# Patient Record
Sex: Female | Born: 1970
Health system: Southern US, Community
[De-identification: ages and names within clinical notes are randomized; demographics above are authoritative.]

## PROBLEM LIST (undated history)

## (undated) DIAGNOSIS — R112 Nausea with vomiting, unspecified: Secondary | ICD-10-CM

## (undated) DIAGNOSIS — F419 Anxiety disorder, unspecified: Secondary | ICD-10-CM

## (undated) DIAGNOSIS — J45909 Unspecified asthma, uncomplicated: Secondary | ICD-10-CM

## (undated) DIAGNOSIS — Z9889 Other specified postprocedural states: Secondary | ICD-10-CM

## (undated) DIAGNOSIS — R609 Edema, unspecified: Secondary | ICD-10-CM

## (undated) DIAGNOSIS — M79A29 Nontraumatic compartment syndrome of unspecified lower extremity: Secondary | ICD-10-CM

## (undated) DIAGNOSIS — T79A21A Traumatic compartment syndrome of right lower extremity, initial encounter: Secondary | ICD-10-CM

## (undated) DIAGNOSIS — K219 Gastro-esophageal reflux disease without esophagitis: Secondary | ICD-10-CM

## (undated) DIAGNOSIS — G4733 Obstructive sleep apnea (adult) (pediatric): Secondary | ICD-10-CM

## (undated) DIAGNOSIS — E039 Hypothyroidism, unspecified: Secondary | ICD-10-CM

## (undated) HISTORY — PX: ENDOMETRIAL ABLATION: SHX621

## (undated) HISTORY — PX: TUBAL LIGATION: SHX77

## (undated) HISTORY — DX: Obstructive sleep apnea (adult) (pediatric): G47.33

## (undated) HISTORY — PX: ROTATOR CUFF REPAIR: SHX139

## (undated) HISTORY — PX: ABDOMINAL HYSTERECTOMY: SHX81

## (undated) HISTORY — PX: DILATION AND CURETTAGE OF UTERUS: SHX78

---

## 1998-09-24 ENCOUNTER — Encounter: Admission: RE | Admit: 1998-09-24 | Discharge: 1998-12-23 | Payer: Self-pay | Admitting: Gynecology

## 1998-11-07 ENCOUNTER — Inpatient Hospital Stay (HOSPITAL_COMMUNITY): Admission: AD | Admit: 1998-11-07 | Discharge: 1998-11-10 | Payer: Self-pay | Admitting: Gynecology

## 1998-12-25 ENCOUNTER — Other Ambulatory Visit: Admission: RE | Admit: 1998-12-25 | Discharge: 1998-12-25 | Payer: Self-pay | Admitting: Gynecology

## 2000-01-15 ENCOUNTER — Other Ambulatory Visit: Admission: RE | Admit: 2000-01-15 | Discharge: 2000-01-15 | Payer: Self-pay | Admitting: Gynecology

## 2000-09-26 ENCOUNTER — Inpatient Hospital Stay (HOSPITAL_COMMUNITY): Admission: AD | Admit: 2000-09-26 | Discharge: 2000-09-28 | Payer: Self-pay | Admitting: Gynecology

## 2000-09-30 ENCOUNTER — Encounter: Admission: RE | Admit: 2000-09-30 | Discharge: 2000-11-01 | Payer: Self-pay | Admitting: Gynecology

## 2000-11-15 ENCOUNTER — Other Ambulatory Visit: Admission: RE | Admit: 2000-11-15 | Discharge: 2000-11-15 | Payer: Self-pay | Admitting: Gynecology

## 2002-01-19 ENCOUNTER — Other Ambulatory Visit: Admission: RE | Admit: 2002-01-19 | Discharge: 2002-01-19 | Payer: Self-pay | Admitting: Gynecology

## 2002-08-06 ENCOUNTER — Encounter: Admission: RE | Admit: 2002-08-06 | Discharge: 2002-08-06 | Payer: Self-pay | Admitting: Family Medicine

## 2002-09-13 ENCOUNTER — Encounter: Admission: RE | Admit: 2002-09-13 | Discharge: 2002-09-13 | Payer: Self-pay | Admitting: Family Medicine

## 2002-10-01 ENCOUNTER — Encounter: Admission: RE | Admit: 2002-10-01 | Discharge: 2002-10-01 | Payer: Self-pay | Admitting: Family Medicine

## 2002-11-02 ENCOUNTER — Encounter: Admission: RE | Admit: 2002-11-02 | Discharge: 2002-11-02 | Payer: Self-pay | Admitting: Family Medicine

## 2002-11-23 ENCOUNTER — Encounter: Admission: RE | Admit: 2002-11-23 | Discharge: 2002-11-23 | Payer: Self-pay | Admitting: Family Medicine

## 2003-10-04 ENCOUNTER — Other Ambulatory Visit: Admission: RE | Admit: 2003-10-04 | Discharge: 2003-10-04 | Payer: Self-pay | Admitting: Gynecology

## 2005-02-24 ENCOUNTER — Other Ambulatory Visit: Admission: RE | Admit: 2005-02-24 | Discharge: 2005-02-24 | Payer: Self-pay | Admitting: Gynecology

## 2005-11-30 ENCOUNTER — Ambulatory Visit: Payer: Self-pay | Admitting: Internal Medicine

## 2006-01-06 ENCOUNTER — Ambulatory Visit: Payer: Self-pay | Admitting: Internal Medicine

## 2006-12-15 ENCOUNTER — Ambulatory Visit (HOSPITAL_COMMUNITY): Admission: RE | Admit: 2006-12-15 | Discharge: 2006-12-15 | Payer: Self-pay | Admitting: Obstetrics and Gynecology

## 2006-12-15 ENCOUNTER — Encounter (INDEPENDENT_AMBULATORY_CARE_PROVIDER_SITE_OTHER): Payer: Self-pay | Admitting: Specialist

## 2006-12-19 ENCOUNTER — Inpatient Hospital Stay (HOSPITAL_COMMUNITY): Admission: AD | Admit: 2006-12-19 | Discharge: 2006-12-19 | Payer: Self-pay | Admitting: Obstetrics & Gynecology

## 2008-01-05 ENCOUNTER — Encounter: Admission: RE | Admit: 2008-01-05 | Discharge: 2008-01-05 | Payer: Self-pay | Admitting: Gastroenterology

## 2008-02-14 ENCOUNTER — Ambulatory Visit: Payer: Self-pay | Admitting: Vascular Surgery

## 2011-05-04 NOTE — Procedures (Signed)
UPPER EXTREMITY ARTERIAL EVALUATION-SEGMENTALS   INDICATION:  Both hands feel cold.   HISTORY:  Diabetes:  No.  Cardiac:  No.  Hypertension:  No.  Smoking:  No.  Previous Surgery:  No.   RESTING SYSTOLIC PRESSURES                                   RIGHT                LEFT  Brachial:                        110                  110  Radial:                          110                  110  Ulnar:                           110                  106  DOPPLER WAVEFORM ANALYSIS:  Brachial:                        Triphasic            Triphasic  Radial:                          Triphasic            Triphasic  Ulnar:                           Triphasic            Triphasic  Superficial palmar arch:   IMPRESSION:  1. No evidence of upper extremity arterial occlusive disease      bilaterally.  2. PPG wave forms of both fourth fingers return to normal within 10      minutes post cold challenge.   ___________________________________________  Janetta Hora. Darrick Penna, MD   DP/MEDQ  D:  02/15/2008  T:  02/16/2008  Job:  [id number]

## 2011-05-04 NOTE — Assessment & Plan Note (Signed)
OFFICE VISIT   PA, TENNANT  DOB:  January 21, 1971                                       02/14/2008  ZOXWR#:[id number]   Patient is a 40 year old female who is very physically active and  recently noticed that her left second toe had become purple and painful  during a recent cold spell.  This has been present for a few weeks now.  She denies any prior similar episodes.  She states that always her hands  and feet have become cold easily.  She has never had any ulcerations or  nonhealing wounds on the feet.  She denies rest pain or claudication  symptoms.  She denies any history of diabetes, hypertension, elevated  cholesterol, coronary artery disease, and has no significant family  history of vascular disease.  She is a nonsmoker.   PAST SURGICAL HISTORY:  Unremarkable.   PAST MEDICAL HISTORY:  Remarkable for chronic migraines.   MEDICATIONS:  1. Prozac 20 mg once daily.  2. Topamax 150 mg once daily.  3. Hyoscyamine 0.375 mg once daily.  4. Triamterene/hydrochlorothiazide 75/50 once daily.  This is given      for swelling.  5. Phentermine 37.5 mg once daily.   She has no known drug allergies.   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  She has two children.  She is a nondrinker of alcohol.  She  does not smoke cigarettes.   REVIEW OF SYSTEMS:  She has had some varying of her weight up and down.  She is 5 feet 8, 162 pounds.  She denies a history of chest pain,  cardiac arrhythmia, asthma, or wheezing.  She does have chronic  abdominal pain with diarrhea and constipation.  She denies a history of  renal dysfunction, TIA, or stroke.  She has no history of DVT or  superficial thrombophlebitis.  She does have a history of chronic  migraines.  She also has a history of some mild depression and anxiety.  She denies a history of a hypercoagulable state or any recent changes in  her vision or hearing.   PHYSICAL EXAMINATION:  Blood pressure is 130/81 in the left arm.  Heart  rate is 100 and regular.  HEENT:  Unremarkable.  She has 2+ carotid  pulses without bruits.  Chest:  Clear to auscultation.  Cardiac:  Regular rate and rhythm without murmur.  Abdomen is soft, nontender and  nondistended.  No masses.  She has 2+ femoral and 2+ dorsalis pedis  pulses bilaterally.  She has 2+ brachial and radial pulses bilaterally.  The left second toe is slightly darker, compared to the other toes of  the left and right foot.  Otherwise, there are no ulcerations on the  feet, and the feet are warm and adequately perfused.   She underwent a cold challenge noninvasive vascular exam.  Her hands and  feet were within normal limits with cold challenge.  The right second  toe was also within normal limits to cold challenge.  This returned to  normal perfusion after five minutes of immersion in cold water.  The  left second toe returned to normal after 10 minutes, which was mildly  abnormal.  However, overall, her noninvasive vascular exam was not  completely consistent with Raynaud's or vasospastic-type disorders.  In summary, patient has had some type of ischemic insult to her left  second toe.  She does not display any considerable risk factors for  hypercoagulable state and essentially has an overall negative exam for  vasospastic disease.  This could possibly represent some type of embolic  event.  I believe the best management for now is close observation.  We  will see her back in one month's time or sooner if she has further  deterioration of her symptoms.  At that time, we will consider whether  or not to pursue further evaluation of her toe lesion.   Janetta Hora. Fields, MD  Electronically Signed   CEF/MEDQ  D:  02/22/2008  T:  02/22/2008  Job:  [id number]   cc:   Tammy R. Collins [place], M.D.

## 2011-05-04 NOTE — Procedures (Signed)
LOWER EXTREMITY ARTERIAL EVALUATION-SINGLE LEVEL   INDICATION:  Left second toe, fingers feel cold.   HISTORY:  Diabetes:  No.  Cardiac:  No.  Hypertension:  No.  Smoking:  No.  Previous Surgery:  No.   RESTING SYSTOLIC PRESSURES: (ABI)                          RIGHT                LEFT  Brachial:               110                  110  Anterior tibial:        110                  110  Posterior tibial:       122 (greater than 1.0)                    124  (greater than 1.0)  Peroneal:  DOPPLER WAVEFORM ANALYSIS:  Anterior tibial:        Triphasic            Triphasic  Posterior tibial:       Triphasic            Triphasic  Peroneal:   PREVIOUS ABI'S:  Date:  RIGHT:  LEFT:   IMPRESSION:  1. No evidence of lower extremity arterial occlusive disease      bilaterally.  2. PPG waveforms of right and left second toes return to normal within      10 minutes post cold challenge.   ___________________________________________  Janetta Hora. Darrick Penna, MD   DP/MEDQ  D:  02/15/2008  T:  02/16/2008  Job:  [id number]

## 2011-05-07 NOTE — Discharge Summary (Signed)
[facility]  Patient:    Theresa Cuevas, Theresa Cuevas              MRN: [id number] Adm. Date:  [id number] Disc. Date: [id number] Attending:  Douglass Rivers Dictator:   Antony Contras, [facility]                           Discharge Summary  DISCHARGE DIAGNOSES:          Intrauterine pregnancy at term, history of depression, RhoGAM candidate.  PROCEDURE:                    Normal spontaneous vaginal delivery over intact perineum, reduction of nuchal cord.  HISTORY OF PRESENT ILLNESS:   Patient is a 40 year old gravida 3, para 1-0-1-1 with LMP December 24, 1999, [facility] September 29, 2000.  Prenatal risk factors include a history of depression.  Patient was on Prozac during the pregnancy.  She is also Rh negative.  PRENATAL LABORATORIES:        Blood type Rh A-.  Antibody screen negative. RPR, HBsAg, HIV nonreactive.  Rubella immune.  MSAFP normal.  GBS negative.  HOSPITAL COURSE:              Patient was admitted on September 26, 2000 with uterine contractions.  Cervix on admission was 5 cm, 70%, -2.  Patient progressed very quickly to complete dilatation and was delivered of an Apgar 9/9 female infant over an intact perineum.  Birth weight 9 pounds 2 ounces. Nuchal cord reduced.  Postpartum course:  Patient remained afebrile.  Had no difficulty voiding.  Was able to be discharged on her second postpartum day. CBC:  Hematocrit 30.3, hemoglobin 10.9, platelets ______, WBC 13.1.  Baby was also Rh negative so she was not a RhoGAM candidate.  ______.  DISPOSITION:                  Follow up in six weeks.  Continue prenatal vitamins and iron.  Motrin, Tylox for pain. DD:  10/19/00 TD:  10/19/00 Job: [id number] XL/KG401

## 2011-05-07 NOTE — Op Note (Signed)
Theresa Cuevas, Theresa Cuevas          ACCOUNT NO.:  [id number]   MEDICAL RECORD NO.:  [id number]          PATIENT TYPE:  AMB   LOCATION:  SDC                           FACILITY:  [facility]   PHYSICIAN:  Michelle L. Grewal, M.D.DATE OF BIRTH:  1971/01/11   DATE OF PROCEDURE:  12/15/2006  DATE OF DISCHARGE:                               OPERATIVE REPORT   PREOPERATIVE DIAGNOSES:  1. Pelvic pain.  2. Desires permanent sterilization.  3. Menorrhagia.   POSTOPERATIVE DIAGNOSES:  1. Pelvic pain.  2. Desires permanent sterilization.  3. Menorrhagia.  4. Left ovarian cyst.   PROCEDURES:  1. Diagnostic laparoscopy.  2. Bilateral tubal ligation.  3. Drainage of left ovarian cyst.  4. Dilatation and curettage.  5. Diagnostic hysteroscopy.  6. ThermaChoice endometrial ablation.   SURGEON:  Michelle L. Vincente Poli, M.D.   ANESTHESIA:  General.   SPECIMENS:  Uterine curettings.   ESTIMATED BLOOD LOSS:  Minimal.   COMPLICATIONS:  None.   PROCEDURE:  The patient was taken to the operating room.  She was  intubated.  She was prepped and draped.  An in-and-out catheter was used  to empty the bladder.  A uterine manipulator was inserted.  Attention  was turned to the abdomen.  Local was infiltrated at the umbilicus and  the suprapubic region.  A small incision was made in the umbilicus and a  Veress needle was inserted one time and pneumoperitoneum was achieved  without difficulty.  The Veress needle was removed, the trocar was  inserted.  The patient was gently placed in Trendelenburg position and  the laparoscope was introduced through the trocar sheath.  A 5-mm trocar  was placed suprapubically in the midline under direct visualization.  We  then inspected the upper abdomen, which was normal.  Liver and  gallbladder were normal.  No adhesions were noted, and the bowel  surfaces appeared normal.  The pelvis appeared normal as well.  The  uterus was normal, was mobile, midpositional.  The  cul-de-sac was clean.  There was no evidence of endometriosis anywhere in the pelvis.  The  tubes and ovaries were unremarkable except for a small simple cyst on  the left ovary, which I then drained using Kleppingers.  There was no  evidence of endometriosis.  I saw nothing that might explain this  patient's left-sided pelvic pain.  I did go ahead and grasp the  midportion of each tube and did a tubal ligation bilaterally using the  triple burn cautery technique with Kleppingers.  The pneumoperitoneum  was released, the trocars were removed, and the incisions were closed  with Dermabond skin adhesive with excellent approximation.  All sponge,  lap and instrument counts were correct.  At this point we then went  vaginally, where I placed a speculum in the vagina, removed the uterine  manipulator and then dilated the cervical internal os.  The diagnostic  hysteroscope was inserted.  The uterus cavity appeared normal.  There  was some menstrual blood noted and endometrium but nothing that looked  like a fibroid or polyp.  The hysteroscope was removed and a thorough  uterine curettage was  performed.  All tissue was removed and sent to  pathology.  At this point, the ThermaChoice III balloon was inserted and  ThermaChoice endometrial ablation was performed in the standard fashion  for 8 minutes.  At the end of the procedure the intact balloon was  removed.  There was no bleeding noted.  All instruments were removed  from the vagina.  All sponge, lap and instrument counts were correct x2.  The patient went to the recovery room in stable condition.      Michelle L. Vincente Poli, M.D.  Electronically Signed     MLG/MEDQ  D:  12/15/2006  T:  12/15/2006  Job:  [id number]

## 2011-11-10 ENCOUNTER — Encounter (HOSPITAL_COMMUNITY): Payer: Self-pay | Admitting: Pharmacist

## 2011-11-12 NOTE — Patient Instructions (Addendum)
   Your procedure is scheduled ON:GEXBMWU December 4th  Enter through the Main Entrance of [facility] at:6am Pick up the phone at the desk and dial [phone] and inform us of your arrival.  Please call this number if you have any problems the morning of surgery: [phone]  Remember: Do not eat food after midnight:Monday Do not drink clear liquids after:midnight Monday Take these medicines the morning of surgery with a SIP OF WATER:Prozac, tagamet,  Do not wear jewelry, make-up, or FINGER nail polish Do not wear lotions, powders, or perfumes.  You may not  wear deodorant. Do not shave 48 hours prior to surgery. Do not bring valuables to the hospital.  Leave suitcase in the car. After Surgery it may be brought to your room. For patients being admitted to the hospital, checkout time is 11:00am the day of discharge.     Remember to use your hibiclens as instructed.Please shower with 1/2 bottle the evening before your surgery and the other 1/2 bottle the morning of surgery.

## 2011-11-16 ENCOUNTER — Encounter (HOSPITAL_COMMUNITY)
Admission: RE | Admit: 2011-11-16 | Discharge: 2011-11-16 | Disposition: A | Discharge status: Home / Self Care | Payer: BC Managed Care – PPO | Source: Ambulatory Visit | Attending: Obstetrics and Gynecology | Admitting: Obstetrics and Gynecology

## 2011-11-16 ENCOUNTER — Encounter (HOSPITAL_COMMUNITY): Payer: Self-pay

## 2011-11-16 HISTORY — DX: Anxiety disorder, unspecified: F41.9

## 2011-11-16 HISTORY — DX: Other specified postprocedural states: Z98.890

## 2011-11-16 HISTORY — DX: Nausea with vomiting, unspecified: R11.2

## 2011-11-16 HISTORY — DX: Edema, unspecified: R60.9

## 2011-11-16 HISTORY — DX: Gastro-esophageal reflux disease without esophagitis: K21.9

## 2011-11-16 LAB — CBC
HCT: 42 % (ref 36.0–46.0)
Hemoglobin: 14.7 g/dL (ref 12.0–15.0)
MCV: 89.4 fL (ref 78.0–100.0)
Platelets: 284 10*3/uL (ref 150–400)
RBC: 4.7 MIL/uL (ref 3.87–5.11)
WBC: 7.3 10*3/uL (ref 4.0–10.5)

## 2011-11-16 LAB — BASIC METABOLIC PANEL
CO2: 27 mEq/L (ref 19–32)
Chloride: 99 mEq/L (ref 96–112)
GFR calc non Af Amer: 78 mL/min — ABNORMAL LOW (ref >90)
Glucose, Bld: 101 mg/dL — ABNORMAL HIGH (ref 70–99)
Potassium: 3.9 mEq/L (ref 3.5–5.1)
Sodium: 136 mEq/L (ref 135–145)

## 2011-11-22 MED ORDER — CLINDAMYCIN PHOSPHATE 900 MG/50ML IV SOLN
900.0000 mg | INTRAVENOUS | Status: DC
  Filled 2011-11-22: qty 50

## 2011-11-22 MED ORDER — CIPROFLOXACIN IN D5W 400 MG/200ML IV SOLN
400.0000 mg | INTRAVENOUS | Status: DC
  Filled 2011-11-22: qty 200

## 2011-11-22 NOTE — H&P (Signed)
40 year old Gravida 3 Para 2 presents for LAVH and possible perineoplasty.  She complains of terrible dysmenorrhea.  She tried Generesse but the symptoms did not improve.  Her period is very heavy.  She has history of endometrial ablation and bilateral tubal ligation.  She also feels like her tampon does not stay in rit. Medical history Headaches  Surgical history btl Endometrial ablation  Meds prozac  Allergies Amoxicillin and Progesterone  Ros  Unremarkable  Social history No alcohol or tobacco  Family history Positive for pancreatic and lung cancer  Afebrile Vital signs stable General alert and oriented Lung CTAB Car RRR Abdomen is soft and non tender Pelvic  uterus is normal size, retroverted tender adnexae not enlarged  Impression Dysmenorrhea Menorrhagia  Plan LAVH Possible perineoplasty Risks are reviewed with patient Outlined in office chart

## 2011-11-23 ENCOUNTER — Encounter (HOSPITAL_COMMUNITY)
Admission: RE | Disposition: A | Discharge status: Home / Self Care | Payer: Self-pay | Source: Ambulatory Visit | Attending: Obstetrics and Gynecology

## 2011-11-23 ENCOUNTER — Encounter (HOSPITAL_COMMUNITY): Payer: Self-pay | Admitting: Anesthesiology

## 2011-11-23 ENCOUNTER — Ambulatory Visit (HOSPITAL_COMMUNITY)
Admission: RE | Admit: 2011-11-23 | Discharge: 2011-11-24 | Disposition: A | Discharge status: Home / Self Care | Payer: BC Managed Care – PPO | Source: Ambulatory Visit | Attending: Obstetrics and Gynecology | Admitting: Obstetrics and Gynecology

## 2011-11-23 ENCOUNTER — Ambulatory Visit (HOSPITAL_COMMUNITY): Payer: BC Managed Care – PPO | Admitting: Anesthesiology

## 2011-11-23 ENCOUNTER — Other Ambulatory Visit: Payer: Self-pay | Admitting: Obstetrics and Gynecology

## 2011-11-23 DIAGNOSIS — N946 Dysmenorrhea, unspecified: Secondary | ICD-10-CM | POA: Insufficient documentation

## 2011-11-23 DIAGNOSIS — Z90710 Acquired absence of both cervix and uterus: Secondary | ICD-10-CM

## 2011-11-23 DIAGNOSIS — N8189 Other female genital prolapse: Secondary | ICD-10-CM | POA: Insufficient documentation

## 2011-11-23 DIAGNOSIS — N949 Unspecified condition associated with female genital organs and menstrual cycle: Secondary | ICD-10-CM | POA: Insufficient documentation

## 2011-11-23 DIAGNOSIS — N8000 Endometriosis of the uterus, unspecified: Secondary | ICD-10-CM | POA: Insufficient documentation

## 2011-11-23 HISTORY — PX: PERINEOPLASTY: SHX2218

## 2011-11-23 HISTORY — PX: LAPAROSCOPIC ASSISTED VAGINAL HYSTERECTOMY: SHX5398

## 2011-11-23 SURGERY — HYSTERECTOMY, VAGINAL, LAPAROSCOPY-ASSISTED
Anesthesia: General | Site: Abdomen | Wound class: Clean Contaminated

## 2011-11-23 MED ORDER — TEMAZEPAM 15 MG PO CAPS: 15.0000 mg | ORAL_CAPSULE | Freq: Every evening | ORAL | Status: DC | PRN

## 2011-11-23 MED ORDER — LIDOCAINE HCL (CARDIAC) 20 MG/ML IV SOLN
INTRAVENOUS | Status: DC | PRN
  Administered 2011-11-23: 60 mg via INTRAVENOUS

## 2011-11-23 MED ORDER — PROPOFOL 10 MG/ML IV EMUL
INTRAVENOUS | Status: AC
  Filled 2011-11-23: qty 20

## 2011-11-23 MED ORDER — NEOSTIGMINE METHYLSULFATE 1 MG/ML IJ SOLN
INTRAMUSCULAR | Status: DC | PRN
  Administered 2011-11-23: 1 mg via INTRAVENOUS

## 2011-11-23 MED ORDER — MIDAZOLAM HCL 2 MG/2ML IJ SOLN
INTRAMUSCULAR | Status: AC
  Filled 2011-11-23: qty 2

## 2011-11-23 MED ORDER — GLYCOPYRROLATE 0.2 MG/ML IJ SOLN
INTRAMUSCULAR | Status: DC | PRN
  Administered 2011-11-23: 0.2 mg via INTRAVENOUS
  Administered 2011-11-23: 0.1 mg via INTRAVENOUS

## 2011-11-23 MED ORDER — PROMETHAZINE HCL 25 MG/ML IJ SOLN: 12.5000 mg | INTRAMUSCULAR | Status: DC | PRN

## 2011-11-23 MED ORDER — KETOROLAC TROMETHAMINE 30 MG/ML IJ SOLN
30.0000 mg | Freq: Once | INTRAMUSCULAR | Status: AC
  Administered 2011-11-23: 30 mg via INTRAVENOUS
  Filled 2011-11-23: qty 1

## 2011-11-23 MED ORDER — HYDROMORPHONE HCL PF 1 MG/ML IJ SOLN
INTRAMUSCULAR | Status: AC
  Administered 2011-11-23: 0.5 mg via INTRAVENOUS
  Filled 2011-11-23: qty 1

## 2011-11-23 MED ORDER — SODIUM CHLORIDE 0.9 % IJ SOLN: 9.0000 mL | INTRAMUSCULAR | Status: DC | PRN

## 2011-11-23 MED ORDER — TRAMADOL HCL 50 MG PO TABS: 50.0000 mg | ORAL_TABLET | Freq: Four times a day (QID) | ORAL | Status: DC | PRN

## 2011-11-23 MED ORDER — SCOPOLAMINE 1 MG/3DAYS TD PT72
1.0000 | MEDICATED_PATCH | TRANSDERMAL | Status: DC
  Administered 2011-11-23: 1.5 mg via TRANSDERMAL

## 2011-11-23 MED ORDER — LIDOCAINE HCL (CARDIAC) 20 MG/ML IV SOLN
INTRAVENOUS | Status: AC
  Filled 2011-11-23: qty 5

## 2011-11-23 MED ORDER — CIPROFLOXACIN IN D5W 400 MG/200ML IV SOLN
INTRAVENOUS | Status: DC | PRN
  Administered 2011-11-23: 400 mg via INTRAVENOUS

## 2011-11-23 MED ORDER — HYDROMORPHONE HCL PF 1 MG/ML IJ SOLN
INTRAMUSCULAR | Status: AC
  Filled 2011-11-23: qty 1

## 2011-11-23 MED ORDER — DIPHENHYDRAMINE HCL 50 MG/ML IJ SOLN
INTRAMUSCULAR | Status: AC
  Administered 2011-11-23: 12.5 mg via INTRAVENOUS
  Filled 2011-11-23: qty 1

## 2011-11-23 MED ORDER — LACTATED RINGERS IV SOLN
INTRAVENOUS | Status: DC
  Administered 2011-11-23: 1000 mL via INTRAVENOUS
  Administered 2011-11-23: 08:00:00 via INTRAVENOUS

## 2011-11-23 MED ORDER — DEXAMETHASONE SODIUM PHOSPHATE 10 MG/ML IJ SOLN
INTRAMUSCULAR | Status: AC
  Filled 2011-11-23: qty 1

## 2011-11-23 MED ORDER — NALOXONE HCL 0.4 MG/ML IJ SOLN: 0.4000 mg | INTRAMUSCULAR | Status: DC | PRN

## 2011-11-23 MED ORDER — DEXTROSE IN LACTATED RINGERS 5 % IV SOLN
INTRAVENOUS | Status: DC
  Administered 2011-11-23 – 2011-11-24 (×3): via INTRAVENOUS

## 2011-11-23 MED ORDER — ONDANSETRON HCL 4 MG/2ML IJ SOLN: 4.0000 mg | Freq: Four times a day (QID) | INTRAMUSCULAR | Status: DC | PRN

## 2011-11-23 MED ORDER — ONDANSETRON HCL 4 MG/2ML IJ SOLN
INTRAMUSCULAR | Status: AC
  Filled 2011-11-23: qty 2

## 2011-11-23 MED ORDER — SODIUM CHLORIDE 0.9 % IJ SOLN
INTRAMUSCULAR | Status: DC | PRN
  Administered 2011-11-23: 10 mL via INTRAVENOUS

## 2011-11-23 MED ORDER — KETOROLAC TROMETHAMINE 30 MG/ML IJ SOLN
INTRAMUSCULAR | Status: DC | PRN
  Administered 2011-11-23: 30 mg via INTRAVENOUS

## 2011-11-23 MED ORDER — MENTHOL 3 MG MT LOZG: 1.0000 | LOZENGE | OROMUCOSAL | Status: DC | PRN

## 2011-11-23 MED ORDER — ROCURONIUM BROMIDE 50 MG/5ML IV SOLN
INTRAVENOUS | Status: AC
  Filled 2011-11-23: qty 1

## 2011-11-23 MED ORDER — IBUPROFEN 600 MG PO TABS
600.0000 mg | ORAL_TABLET | Freq: Four times a day (QID) | ORAL | Status: DC | PRN
  Administered 2011-11-24: 600 mg via ORAL
  Filled 2011-11-23: qty 1

## 2011-11-23 MED ORDER — KETOROLAC TROMETHAMINE 30 MG/ML IJ SOLN
INTRAMUSCULAR | Status: AC
  Filled 2011-11-23: qty 1

## 2011-11-23 MED ORDER — DIPHENHYDRAMINE HCL 12.5 MG/5ML PO ELIX
12.5000 mg | ORAL_SOLUTION | Freq: Four times a day (QID) | ORAL | Status: DC | PRN
  Administered 2011-11-23 – 2011-11-24 (×3): 12.5 mg via ORAL
  Filled 2011-11-23 (×3): qty 5

## 2011-11-23 MED ORDER — MIDAZOLAM HCL 5 MG/5ML IJ SOLN
INTRAMUSCULAR | Status: DC | PRN
  Administered 2011-11-23: 2 mg via INTRAVENOUS

## 2011-11-23 MED ORDER — DIPHENHYDRAMINE HCL 50 MG/ML IJ SOLN
12.5000 mg | Freq: Once | INTRAMUSCULAR | Status: AC
  Administered 2011-11-23: 12.5 mg via INTRAVENOUS

## 2011-11-23 MED ORDER — HYDROMORPHONE HCL PF 1 MG/ML IJ SOLN
0.2500 mg | INTRAMUSCULAR | Status: DC | PRN
  Administered 2011-11-23 (×4): 0.5 mg via INTRAVENOUS

## 2011-11-23 MED ORDER — BUPIVACAINE HCL (PF) 0.25 % IJ SOLN
INTRAMUSCULAR | Status: DC | PRN
  Administered 2011-11-23: 10 mL

## 2011-11-23 MED ORDER — CLINDAMYCIN PHOSPHATE 600 MG/50ML IV SOLN
INTRAVENOUS | Status: DC | PRN
  Administered 2011-11-23: 900 mg via INTRAVENOUS

## 2011-11-23 MED ORDER — HYDROMORPHONE HCL PF 1 MG/ML IJ SOLN
INTRAMUSCULAR | Status: DC | PRN
  Administered 2011-11-23 (×2): 1 mg via INTRAVENOUS

## 2011-11-23 MED ORDER — SCOPOLAMINE 1 MG/3DAYS TD PT72
MEDICATED_PATCH | TRANSDERMAL | Status: AC
  Filled 2011-11-23: qty 1

## 2011-11-23 MED ORDER — NEOSTIGMINE METHYLSULFATE 1 MG/ML IJ SOLN
INTRAMUSCULAR | Status: AC
  Filled 2011-11-23: qty 10

## 2011-11-23 MED ORDER — ROCURONIUM BROMIDE 100 MG/10ML IV SOLN
INTRAVENOUS | Status: DC | PRN
  Administered 2011-11-23: 30 mg via INTRAVENOUS

## 2011-11-23 MED ORDER — ONDANSETRON HCL 4 MG/2ML IJ SOLN
INTRAMUSCULAR | Status: DC | PRN
  Administered 2011-11-23: 4 mg via INTRAVENOUS

## 2011-11-23 MED ORDER — DIPHENHYDRAMINE HCL 50 MG/ML IJ SOLN
12.5000 mg | Freq: Four times a day (QID) | INTRAMUSCULAR | Status: DC | PRN
  Administered 2011-11-23: 12.5 mg via INTRAVENOUS
  Filled 2011-11-23: qty 1

## 2011-11-23 MED ORDER — GLYCOPYRROLATE 0.2 MG/ML IJ SOLN
INTRAMUSCULAR | Status: AC
  Filled 2011-11-23: qty 1

## 2011-11-23 MED ORDER — LACTATED RINGERS IR SOLN
Status: DC | PRN
  Administered 2011-11-23: 3000 mL

## 2011-11-23 MED ORDER — PROPOFOL 10 MG/ML IV EMUL
INTRAVENOUS | Status: DC | PRN
  Administered 2011-11-23: 150 mg via INTRAVENOUS

## 2011-11-23 MED ORDER — FENTANYL CITRATE 0.05 MG/ML IJ SOLN
INTRAMUSCULAR | Status: DC | PRN
  Administered 2011-11-23: 100 ug via INTRAVENOUS
  Administered 2011-11-23: 50 ug via INTRAVENOUS
  Administered 2011-11-23: 100 ug via INTRAVENOUS

## 2011-11-23 MED ORDER — FENTANYL CITRATE 0.05 MG/ML IJ SOLN
INTRAMUSCULAR | Status: AC
  Filled 2011-11-23: qty 5

## 2011-11-23 MED ORDER — HYDROMORPHONE 0.3 MG/ML IV SOLN
INTRAVENOUS | Status: DC
  Administered 2011-11-23: 7.5 mg via INTRAVENOUS
  Administered 2011-11-23: 3.59 mg via INTRAVENOUS
  Administered 2011-11-23: 8 mg via INTRAVENOUS
  Administered 2011-11-23: 6 mg via INTRAVENOUS
  Administered 2011-11-24: 7.5 mg via INTRAVENOUS
  Administered 2011-11-24: 1.59 mg via INTRAVENOUS
  Administered 2011-11-24: 3.15 mg via INTRAVENOUS
  Filled 2011-11-23: qty 25

## 2011-11-23 SURGICAL SUPPLY — 46 items
BARRIER ADHS 3X4 INTERCEED (GAUZE/BANDAGES/DRESSINGS) IMPLANT
BLADE SURG 15 STRL LF C SS BP (BLADE) ×2 IMPLANT
BLADE SURG 15 STRL SS (BLADE) ×1
CABLE HIGH FREQUENCY MONO STRZ (ELECTRODE) IMPLANT
CATH ROBINSON RED A/P 16FR (CATHETERS) IMPLANT
CHLORAPREP W/TINT 26ML (MISCELLANEOUS) ×3 IMPLANT
CLOTH BEACON ORANGE TIMEOUT ST (SAFETY) ×3 IMPLANT
CONT PATH 16OZ SNAP LID 3702 (MISCELLANEOUS) ×3 IMPLANT
COUNTER NEEDLE 1200 MAGNETIC (NEEDLE) IMPLANT
COVER TABLE BACK 60X90 (DRAPES) ×3 IMPLANT
DECANTER SPIKE VIAL GLASS SM (MISCELLANEOUS) IMPLANT
DERMABOND ADVANCED (GAUZE/BANDAGES/DRESSINGS) ×1
DERMABOND ADVANCED .7 DNX12 (GAUZE/BANDAGES/DRESSINGS) ×2 IMPLANT
ELECT REM PT RETURN 9FT ADLT (ELECTROSURGICAL) ×3
ELECTRODE REM PT RTRN 9FT ADLT (ELECTROSURGICAL) ×2 IMPLANT
EVACUATOR SMOKE 8.L (FILTER) IMPLANT
GLOVE BIO SURGEON STRL SZ 6.5 (GLOVE) ×6 IMPLANT
GLOVE BIOGEL PI IND STRL 6.5 (GLOVE) ×4 IMPLANT
GLOVE BIOGEL PI INDICATOR 6.5 (GLOVE) ×2
GOWN PREVENTION PLUS LG XLONG (DISPOSABLE) ×12 IMPLANT
GOWN STRL REIN XL XLG (GOWN DISPOSABLE) ×3 IMPLANT
NEEDLE HYPO 25X1 1.5 SAFETY (NEEDLE) ×3 IMPLANT
NEEDLE INSUFFLATION 14GA 120MM (NEEDLE) ×3 IMPLANT
NEEDLE SPNL 22GX3.5 QUINCKE BK (NEEDLE) IMPLANT
NS IRRIG 1000ML POUR BTL (IV SOLUTION) ×3 IMPLANT
PACK LAVH (CUSTOM PROCEDURE TRAY) ×3 IMPLANT
PACK VAGINAL MINOR WOMEN LF (CUSTOM PROCEDURE TRAY) ×3 IMPLANT
PAD PREP 24X48 CUFFED NSTRL (MISCELLANEOUS) ×3 IMPLANT
SEALER TISSUE G2 CVD JAW 45CM (ENDOMECHANICALS) ×3 IMPLANT
SET IRRIG TUBING LAPAROSCOPIC (IRRIGATION / IRRIGATOR) ×3 IMPLANT
SOLUTION ELECTROLUBE (MISCELLANEOUS) IMPLANT
STRIP CLOSURE SKIN 1/4X3 (GAUZE/BANDAGES/DRESSINGS) IMPLANT
SUT VIC AB 0 CT1 18XCR BRD8 (SUTURE) ×4 IMPLANT
SUT VIC AB 0 CT1 36 (SUTURE) ×9 IMPLANT
SUT VIC AB 0 CT1 8-18 (SUTURE) ×2
SUT VIC AB 3-0 PS2 18 (SUTURE)
SUT VIC AB 3-0 PS2 18XBRD (SUTURE) IMPLANT
SUT VICRYL 0 TIES 12 18 (SUTURE) ×3 IMPLANT
SUT VICRYL 0 UR6 27IN ABS (SUTURE) ×3 IMPLANT
SYR CONTROL 10ML LL (SYRINGE) ×3 IMPLANT
TOWEL OR 17X24 6PK STRL BLUE (TOWEL DISPOSABLE) ×6 IMPLANT
TRAY FOLEY CATH 14FR (SET/KITS/TRAYS/PACK) ×3 IMPLANT
TROCAR Z-THREAD BLADED 11X100M (TROCAR) ×3 IMPLANT
TROCAR Z-THREAD BLADED 5X100MM (TROCAR) ×3 IMPLANT
WARMER LAPAROSCOPE (MISCELLANEOUS) ×3 IMPLANT
WATER STERILE IRR 1000ML POUR (IV SOLUTION) ×3 IMPLANT

## 2011-11-23 NOTE — Progress Notes (Signed)
History and Physical are on the chart. No significant changes Will proceed with LAVH possible perineoplasty. Patient consented - risks outlined in her her office chart.

## 2011-11-23 NOTE — Transfer of Care (Signed)
Immediate Anesthesia Transfer of Care Note  Patient: Theresa Cuevas  Procedure(s) Performed:  LAPAROSCOPIC ASSISTED VAGINAL HYSTERECTOMY; PERINEOPLASTY  Patient Location: PACU  Anesthesia Type: General  Level of Consciousness: awake, alert  and oriented  Airway & Oxygen Therapy: Patient Spontanous Breathing and Patient connected to nasal cannula oxygen  Post-op Assessment: Report given to PACU RN and Post -op Vital signs reviewed and stable  Post vital signs: Reviewed and stable  Complications: No apparent anesthesia complications

## 2011-11-23 NOTE — Anesthesia Procedure Notes (Signed)
Procedure Name: Intubation Date/Time: 11/23/2011 7:29 AM Performed by: Karleen Dolphin Pre-anesthesia Checklist: Patient identified, Emergency Drugs available, Timeout performed and Suction available Patient Re-evaluated:Patient Re-evaluated prior to inductionOxygen Delivery Method: Circle System Utilized Preoxygenation: Pre-oxygenation with 100% oxygen Intubation Type: IV induction Ventilation: Mask ventilation with difficulty and Oral airway inserted - appropriate to patient size Laryngoscope Size: Mac and 3 Grade View: Grade I Tube type: Oral Tube size: 7.0 mm Number of attempts: 1 Airway Equipment and Method: stylet Placement Confirmation: ETT inserted through vocal cords under direct vision,  breath sounds checked- equal and bilateral and positive ETCO2 Secured at: 21 cm Tube secured with: Tape Dental Injury: Teeth and Oropharynx as per pre-operative assessment

## 2011-11-23 NOTE — Anesthesia Preprocedure Evaluation (Signed)
Anesthesia Evaluation  Patient identified by MRN, date of birth, ID band Patient awake    Reviewed: Allergy & Precautions, H&P , NPO status , Patient's Chart, lab work & pertinent test results, reviewed documented beta blocker date and time   History of Anesthesia Complications (+) PONV  Airway Mallampati: I TM Distance: >3 FB Neck ROM: full    Dental  (+) Teeth Intact   Pulmonary neg pulmonary ROS,  clear to auscultation  Pulmonary exam normal       Cardiovascular Exercise Tolerance: Good neg cardio ROS regular Normal Takes Maxide for fluid retention - not HTN (per pt)   Neuro/Psych PSYCHIATRIC DISORDERS (anxiety) Negative Neurological ROS     GI/Hepatic Neg liver ROS, GERD- (PRN OTC meds)  ,  Endo/Other  Negative Endocrine ROS  Renal/GU negative Renal ROS  Genitourinary negative   Musculoskeletal   Abdominal   Peds  Hematology negative hematology ROS (+)   Anesthesia Other Findings   Reproductive/Obstetrics negative OB ROS                           Anesthesia Physical Anesthesia Plan  ASA: I  Anesthesia Plan: General ETT   Post-op Pain Management:    Induction:   Airway Management Planned:   Additional Equipment:   Intra-op Plan:   Post-operative Plan:   Informed Consent: I have reviewed the patients History and Physical, chart, labs and discussed the procedure including the risks, benefits and alternatives for the proposed anesthesia with the patient or authorized representative who has indicated his/her understanding and acceptance.   Dental Advisory Given  Plan Discussed with: CRNA and Surgeon  Anesthesia Plan Comments:         Anesthesia Quick Evaluation

## 2011-11-23 NOTE — Addendum Note (Signed)
Addendum  created 11/23/11 1349 by Fanny Dance   Modules edited:Notes Section

## 2011-11-23 NOTE — Anesthesia Postprocedure Evaluation (Signed)
  Anesthesia Post-op Note  Patient: Theresa Cuevas  Procedure(s) Performed:  LAPAROSCOPIC ASSISTED VAGINAL HYSTERECTOMY; PERINEOPLASTY  Patient Location: [facility] Unit  Anesthesia Type: General  Level of Consciousness: alert  and oriented  Airway and Oxygen Therapy: Patient Spontanous Breathing  Post-op Pain: mild  Post-op Assessment: Patient's Cardiovascular Status Stable and Respiratory Function Stable  Post-op Vital Signs: stable  Complications: No apparent anesthesia complications

## 2011-11-23 NOTE — Op Note (Signed)
NAMELOGHAN, Theresa Cuevas          ACCOUNT NO.:  [id number]  MEDICAL RECORD NO.:  [id number]  LOCATION:  9304                          FACILITY:  [facility]  PHYSICIAN:  Jalessa Peyser L. Jamisha Hoeschen, M.D.DATE OF BIRTH:  08/06/1971  DATE OF PROCEDURE:  11/23/2011 DATE OF DISCHARGE:                              OPERATIVE REPORT   PREOPERATIVE DIAGNOSES:  Dysmenorrhea, pelvic pain, and perineal laxity.  POSTOPERATIVE DIAGNOSES:  Dysmenorrhea, pelvic pain, and perineal laxity.  PROCEDURE:  LAVH and perineoplasty.  SURGEON:  Mamoudou Mulvehill L. Vincente Poli, M.D.  ASSISTANT:  Dr. Varney Baas.  ANESTHESIA:  General.  ESTIMATED BLOOD LOSS:  200 mL.  COMPLICATIONS:  None.  PATHOLOGY:  Uterus and cervix.  DRAINS:  Foley catheter.  DESCRIPTION OF PROCEDURE:  The patient was taken to the operating room. She was intubated.  She was prepped and draped in usual sterile fashion. Foley catheter was inserted.  Attention was turned to the abdomen where a small infraumbilical incision was made.  A Veress needle was inserted. Pneumoperitoneum was performed without difficulty.  A 11-mm trocar was inserted.  The patient was gently placed in Trendelenburg position.  The pelvis was normal.  Ovaries were normal.  The triple pedicle was identified and the EnSeal instrument was placed across the right triple pedicle that was burned and cauterized down to the round ligament, this was done on the left side in identical fashion.  Hemostasis was excellent.  We then went down to the vagina, placed a weighted speculum in the vagina.  The cervix was grasped with a tenaculum.  A circumferential incision was made with the cervix around the cervix with the Bovie.  The posterior cul-de-sac was entered sharply using Mayo scissors.  The anterior cul-de-sac was entered sharply using Metzenbaum scissors.  Curved Heaney clamps were used to clamp the uterosacral cardinal ligaments.  Each pedicle was clamped, cut, and suture ligated using 0  Vicryl suture.  We carefully walked our way up the broad ligament, staying snug beside the cervix and the uterus.  Each pedicle was clamped, cut, and suture ligated using 0 Vicryl suture.  Once we reached the level of the fundus, the uterus was retroflexed.  The remainder of the broad ligament was clamped on either side.  Using Heaney clamps, the specimen was removed and identified the cervix and uterus, and the pedicles were secured using a free tie and suture ligature of 0 Vicryl suture.  Hemostasis was excellent.  The posterior cuff was closed using 0 Vicryl and running locked stitch.  The cuff was closed completely using 0 Vicryl running stitch.  At this point, we noted that she did have some perineal laxity and she had requested a perineoplasty.  A small V-shaped incision was made to the perineum and I did make an incision probably in the anterior 3 cm of the vagina posteriorly.  We then reduced and dissected the perineal muscles down as well as some of the rectovaginal fascia.  I then reapproximated the tissue in the midline by increasing the support using a series of 2-0 Vicryl interrupted.  I then used an 0 Vicryl at the perineum to bring the bulbocavernosus muscles together with excellent support of the perineum.  The redundant epithelium was  removed and then that was closed with 2-0 Vicryl in a running stitch.  The perineal approximation was excellent after the procedure.  At this point, I then went back up to the abdomen, replaced the pneumoperitoneum, irrigated the pelvis. Hemostasis was excellent.  The trocars and pneumoperitoneum were released.  Incisions were closed using 2-0 Vicryl, and Dermabond was applied at each skin incision.  All sponge, lap, and instrument counts were correct x2.  The patient went to recovery room in stable condition.     Haim Hansson L. Vincente Poli, M.D.     [name]  D:  11/23/2011  T:  11/23/2011  Job:  [id number]

## 2011-11-23 NOTE — Anesthesia Postprocedure Evaluation (Signed)
Anesthesia Post Note  Patient: Theresa Cuevas  Procedure(s) Performed:  LAPAROSCOPIC ASSISTED VAGINAL HYSTERECTOMY; PERINEOPLASTY  Anesthesia type: General  Patient location: PACU  Post pain: Pain level controlled  Post assessment: Post-op Vital signs reviewed  Last Vitals:  Filed Vitals:   11/23/11 0622  BP: 113/79  Pulse: 64  Temp: 36.7 C  Resp: 18    Post vital signs: Reviewed  Level of consciousness: sedated  Complications: No apparent anesthesia complicationsfj

## 2011-11-23 NOTE — Brief Op Note (Signed)
11/23/2011  8:42 AM  PATIENT:  Theresa Cuevas  40 y.o. female  PRE-OPERATIVE DIAGNOSIS:  Dysmenorrhea, pelvic pain, perineal laxity  POST-OPERATIVE DIAGNOSIS:  Dysmenorrhea, pelvic pain, perineal laxity  PROCEDURE:  Procedure(s): LAPAROSCOPIC ASSISTED VAGINAL HYSTERECTOMY PERINEOPLASTY  SURGEON:  Surgeon(s): Jeani Hawking, MD Freddy Finner  PHYSICIAN ASSISTANT:   ASSISTANTS: none   ANESTHESIA:   general  EBL:  Total I/O In: 1000 [I.V.:1000] Out: 400 [Urine:200; Blood:200]  BLOOD ADMINISTERED:none  DRAINS: Urinary Catheter (Foley)   LOCAL MEDICATIONS USED:  NONE  SPECIMEN:  Source of Specimen:  uterus, cervix  DISPOSITION OF SPECIMEN:  PATHOLOGY  COUNTS:  YES  TOURNIQUET:  * No tourniquets in log *  DICTATION: .Other Dictation: Dictation Number [id number]  PLAN OF CARE: Admit for overnight observation  PATIENT DISPOSITION:  PACU - hemodynamically stable.   Delay start of Pharmacological VTE agent (>24hrs) due to surgical blood loss or risk of bleeding:  {YES/NO/NOT APPLICABLE:20182

## 2011-11-24 ENCOUNTER — Encounter (HOSPITAL_COMMUNITY): Payer: Self-pay | Admitting: Obstetrics and Gynecology

## 2011-11-24 LAB — BASIC METABOLIC PANEL
Chloride: 99 mEq/L (ref 96–112)
GFR calc Af Amer: >90 mL/min (ref >90)
Potassium: 3.6 mEq/L (ref 3.5–5.1)

## 2011-11-24 LAB — CBC
Platelets: 222 10*3/uL (ref 150–400)
RDW: 12.6 % (ref 11.5–15.5)
WBC: 12 10*3/uL — ABNORMAL HIGH (ref 4.0–10.5)

## 2011-11-24 MED ORDER — HYDROCODONE-ACETAMINOPHEN 5-325 MG PO TABS
1.0000 | ORAL_TABLET | ORAL | Status: DC | PRN
  Administered 2011-11-24: 1 via ORAL
  Filled 2011-11-24: qty 1

## 2011-11-24 MED ORDER — IBUPROFEN 600 MG PO TABS: 600.0000 mg | ORAL_TABLET | Freq: Four times a day (QID) | ORAL | Status: AC | PRN

## 2011-11-24 MED ORDER — HYDROCODONE-ACETAMINOPHEN 5-325 MG PO TABS: 1.0000 | ORAL_TABLET | ORAL | Status: AC | PRN

## 2011-11-24 MED ORDER — FLUOXETINE HCL 20 MG PO CAPS
40.0000 mg | ORAL_CAPSULE | Freq: Every day | ORAL | Status: DC
  Filled 2011-11-24: qty 2

## 2011-11-24 NOTE — Discharge Summary (Signed)
Admission Diagnosis: Pelvic Pain Dysmenorrhead Perineal laxity  Discharge Diagnosis: Same  Procedure: LAVH Perineoplasty  Hospital Course: 40 year old female admitted for LAVH, Perineoplasty. She did very well post op. By POD#1 she was ambulating, voiding, had good pain control and was tolerating a regular diet. She was discharged home in good condition on POD#1. She will followup with me in 1 week. She was already given her RX for Ibuprofen and Rx Vicodin for pain. No driving for 1 week. No sex for 6 weeks Discharge precautions given to patient.

## 2011-11-24 NOTE — Progress Notes (Signed)
1 Day Post-Op Procedure(s): LAPAROSCOPIC ASSISTED VAGINAL HYSTERECTOMY PERINEOPLASTY  Subjective: Patient reports incisional pain, tolerating PO and + flatus.    Objective: I have reviewed patient's vital signs, intake and output, medications and labs.  General: alert and cooperative Vaginal Bleeding: none Abdomen is soft and non tender  Assessment: s/p Procedure(s): LAPAROSCOPIC ASSISTED VAGINAL HYSTERECTOMY PERINEOPLASTY: stable, progressing well and tolerating diet  Plan: Advance diet Advance to PO medication Discontinue IV fluids Discharge home Rx Ibuprofen and Vicodin already given to patient  LOS: 1 day    Theresa Cuevas L 11/24/2011, 7:14 AM

## 2011-11-24 NOTE — Progress Notes (Signed)
Pt discharged to home with husband.  Condition stable.  Pt ambulated to car with E. Pinion, NT.  Pt home with sitz bath and peri bottle.  No other equipment/supplies ordered for home at discharge.

## 2012-03-14 ENCOUNTER — Telehealth: Payer: Self-pay | Admitting: Internal Medicine

## 2012-03-14 NOTE — Telephone Encounter (Signed)
Yes - looks like she has also seen Dr. Elnoria Howard in 2009 - he ordered a CT

## 2012-03-14 NOTE — Telephone Encounter (Signed)
Dr. Gessner do you approve? 

## 2012-03-14 NOTE — Telephone Encounter (Signed)
Dr. Brodie will you accept? 

## 2012-03-14 NOTE — Telephone Encounter (Signed)
Phone is busy I will continue to try and reach the patient  

## 2012-10-10 ENCOUNTER — Other Ambulatory Visit: Payer: Self-pay | Admitting: Obstetrics and Gynecology

## 2012-11-14 ENCOUNTER — Other Ambulatory Visit: Payer: Self-pay | Admitting: Psychiatry

## 2012-11-21 ENCOUNTER — Ambulatory Visit
Admission: RE | Admit: 2012-11-21 | Discharge: 2012-11-21 | Disposition: A | Discharge status: Home / Self Care | Payer: BC Managed Care – PPO | Source: Ambulatory Visit | Attending: Psychiatry | Admitting: Psychiatry

## 2012-11-21 MED ORDER — GADOBENATE DIMEGLUMINE 529 MG/ML IV SOLN
17.0000 mL | Freq: Once | INTRAVENOUS | Status: AC | PRN
  Administered 2012-11-21: 17 mL via INTRAVENOUS

## 2013-10-24 ENCOUNTER — Other Ambulatory Visit: Payer: Self-pay | Admitting: Obstetrics and Gynecology

## 2015-02-07 ENCOUNTER — Other Ambulatory Visit: Payer: Self-pay | Admitting: Obstetrics and Gynecology

## 2015-02-07 DIAGNOSIS — N6002 Solitary cyst of left breast: Secondary | ICD-10-CM

## 2015-02-10 LAB — CYTOLOGY - PAP

## 2015-02-13 ENCOUNTER — Other Ambulatory Visit: Payer: Self-pay

## 2015-02-14 ENCOUNTER — Ambulatory Visit
Admission: RE | Admit: 2015-02-14 | Discharge: 2015-02-14 | Disposition: A | Discharge status: Home / Self Care | Payer: BLUE CROSS/BLUE SHIELD | Source: Ambulatory Visit | Attending: Obstetrics and Gynecology | Admitting: Obstetrics and Gynecology

## 2015-02-14 DIAGNOSIS — N6002 Solitary cyst of left breast: Secondary | ICD-10-CM

## 2016-05-24 ENCOUNTER — Other Ambulatory Visit: Payer: Self-pay | Admitting: Orthopedic Surgery

## 2016-05-25 ENCOUNTER — Encounter (HOSPITAL_BASED_OUTPATIENT_CLINIC_OR_DEPARTMENT_OTHER): Payer: Self-pay | Admitting: *Deleted

## 2016-05-31 ENCOUNTER — Other Ambulatory Visit: Payer: Self-pay | Admitting: Orthopedic Surgery

## 2016-06-01 ENCOUNTER — Encounter (HOSPITAL_BASED_OUTPATIENT_CLINIC_OR_DEPARTMENT_OTHER): Payer: Self-pay | Admitting: *Deleted

## 2016-06-01 ENCOUNTER — Ambulatory Visit (HOSPITAL_BASED_OUTPATIENT_CLINIC_OR_DEPARTMENT_OTHER)
Admission: RE | Admit: 2016-06-01 | Discharge: 2016-06-01 | Disposition: A | Discharge status: Home / Self Care | Payer: BLUE CROSS/BLUE SHIELD | Source: Ambulatory Visit | Attending: Orthopedic Surgery | Admitting: Orthopedic Surgery

## 2016-06-01 ENCOUNTER — Encounter (HOSPITAL_BASED_OUTPATIENT_CLINIC_OR_DEPARTMENT_OTHER)
Admission: RE | Disposition: A | Discharge status: Home / Self Care | Payer: Self-pay | Source: Ambulatory Visit | Attending: Orthopedic Surgery

## 2016-06-01 ENCOUNTER — Ambulatory Visit (HOSPITAL_BASED_OUTPATIENT_CLINIC_OR_DEPARTMENT_OTHER): Payer: BLUE CROSS/BLUE SHIELD | Admitting: Anesthesiology

## 2016-06-01 DIAGNOSIS — F429 Obsessive-compulsive disorder, unspecified: Secondary | ICD-10-CM | POA: Diagnosis not present

## 2016-06-01 DIAGNOSIS — Z79899 Other long term (current) drug therapy: Secondary | ICD-10-CM | POA: Diagnosis not present

## 2016-06-01 DIAGNOSIS — X58XXXA Exposure to other specified factors, initial encounter: Secondary | ICD-10-CM | POA: Insufficient documentation

## 2016-06-01 DIAGNOSIS — K219 Gastro-esophageal reflux disease without esophagitis: Secondary | ICD-10-CM | POA: Diagnosis not present

## 2016-06-01 DIAGNOSIS — T79A22A Traumatic compartment syndrome of left lower extremity, initial encounter: Secondary | ICD-10-CM | POA: Diagnosis present

## 2016-06-01 DIAGNOSIS — E039 Hypothyroidism, unspecified: Secondary | ICD-10-CM | POA: Diagnosis not present

## 2016-06-01 DIAGNOSIS — M79A29 Nontraumatic compartment syndrome of unspecified lower extremity: Secondary | ICD-10-CM | POA: Diagnosis present

## 2016-06-01 HISTORY — DX: Hypothyroidism, unspecified: E03.9

## 2016-06-01 HISTORY — DX: Unspecified asthma, uncomplicated: J45.909

## 2016-06-01 HISTORY — DX: Traumatic compartment syndrome of right lower extremity, initial encounter: T79.A21A

## 2016-06-01 HISTORY — PX: FASCIOTOMY: SHX132

## 2016-06-01 HISTORY — DX: Nontraumatic compartment syndrome of unspecified lower extremity: M79.A29

## 2016-06-01 SURGERY — FASCIOTOMY, UPPER EXTREMITY
Anesthesia: General | Site: Leg Lower | Laterality: Left

## 2016-06-01 MED ORDER — DEXAMETHASONE SODIUM PHOSPHATE 4 MG/ML IJ SOLN
INTRAMUSCULAR | Status: DC | PRN
  Administered 2016-06-01: 10 mg via INTRAVENOUS

## 2016-06-01 MED ORDER — BACLOFEN 10 MG PO TABS: 10.0000 mg | ORAL_TABLET | Freq: Three times a day (TID) | ORAL | Status: DC

## 2016-06-01 MED ORDER — MEPERIDINE HCL 25 MG/ML IJ SOLN: 6.2500 mg | INTRAMUSCULAR | Status: DC | PRN

## 2016-06-01 MED ORDER — SUFENTANIL CITRATE 50 MCG/ML IV SOLN
INTRAVENOUS | Status: AC
  Filled 2016-06-01: qty 1

## 2016-06-01 MED ORDER — OXYCODONE HCL 5 MG PO TABS: 5.0000 mg | ORAL_TABLET | Freq: Once | ORAL | Status: DC | PRN

## 2016-06-01 MED ORDER — ATROPINE SULFATE 1 MG/ML IJ SOLN
INTRAMUSCULAR | Status: AC
  Filled 2016-06-01: qty 1

## 2016-06-01 MED ORDER — BUPIVACAINE HCL (PF) 0.5 % IJ SOLN
INTRAMUSCULAR | Status: AC
  Filled 2016-06-01: qty 30

## 2016-06-01 MED ORDER — SUCCINYLCHOLINE CHLORIDE 200 MG/10ML IV SOSY
PREFILLED_SYRINGE | INTRAVENOUS | Status: AC
  Filled 2016-06-01: qty 10

## 2016-06-01 MED ORDER — SUFENTANIL CITRATE 50 MCG/ML IV SOLN
INTRAVENOUS | Status: DC | PRN
  Administered 2016-06-01 (×2): 10 ug via INTRAVENOUS

## 2016-06-01 MED ORDER — HYDROMORPHONE HCL 1 MG/ML IJ SOLN
INTRAMUSCULAR | Status: AC
  Filled 2016-06-01: qty 1

## 2016-06-01 MED ORDER — ONDANSETRON HCL 4 MG/2ML IJ SOLN
INTRAMUSCULAR | Status: DC | PRN
  Administered 2016-06-01: 4 mg via INTRAVENOUS

## 2016-06-01 MED ORDER — HYDROMORPHONE HCL 1 MG/ML IJ SOLN
0.2500 mg | INTRAMUSCULAR | Status: DC | PRN
  Administered 2016-06-01 (×2): 0.25 mg via INTRAVENOUS
  Administered 2016-06-01: 0.5 mg via INTRAVENOUS

## 2016-06-01 MED ORDER — LACTATED RINGERS IV SOLN
INTRAVENOUS | Status: DC
  Administered 2016-06-01: 10:00:00 via INTRAVENOUS

## 2016-06-01 MED ORDER — SCOPOLAMINE 1 MG/3DAYS TD PT72
MEDICATED_PATCH | TRANSDERMAL | Status: AC
  Filled 2016-06-01: qty 1

## 2016-06-01 MED ORDER — DEXAMETHASONE SODIUM PHOSPHATE 10 MG/ML IJ SOLN
INTRAMUSCULAR | Status: AC
  Filled 2016-06-01: qty 1

## 2016-06-01 MED ORDER — OXYCODONE HCL 5 MG/5ML PO SOLN: 5.0000 mg | Freq: Once | ORAL | Status: DC | PRN

## 2016-06-01 MED ORDER — LIDOCAINE 2% (20 MG/ML) 5 ML SYRINGE
INTRAMUSCULAR | Status: AC
  Filled 2016-06-01: qty 5

## 2016-06-01 MED ORDER — SENNA-DOCUSATE SODIUM 8.6-50 MG PO TABS: 2.0000 | ORAL_TABLET | Freq: Every day | ORAL | Status: DC

## 2016-06-01 MED ORDER — FENTANYL CITRATE (PF) 100 MCG/2ML IJ SOLN: 50.0000 ug | INTRAMUSCULAR | Status: DC | PRN

## 2016-06-01 MED ORDER — GLYCOPYRROLATE 0.2 MG/ML IJ SOLN: 0.2000 mg | Freq: Once | INTRAMUSCULAR | Status: DC | PRN

## 2016-06-01 MED ORDER — MIDAZOLAM HCL 2 MG/2ML IJ SOLN
INTRAMUSCULAR | Status: AC
  Filled 2016-06-01: qty 2

## 2016-06-01 MED ORDER — ONDANSETRON HCL 4 MG/2ML IJ SOLN
INTRAMUSCULAR | Status: AC
  Filled 2016-06-01: qty 2

## 2016-06-01 MED ORDER — BUPIVACAINE HCL (PF) 0.5 % IJ SOLN
INTRAMUSCULAR | Status: DC | PRN
  Administered 2016-06-01: 20 mL

## 2016-06-01 MED ORDER — EPHEDRINE 5 MG/ML INJ
INTRAVENOUS | Status: AC
  Filled 2016-06-01: qty 10

## 2016-06-01 MED ORDER — HYDROCODONE-ACETAMINOPHEN 5-325 MG PO TABS: 1.0000 | ORAL_TABLET | Freq: Four times a day (QID) | ORAL | Status: DC | PRN

## 2016-06-01 MED ORDER — ONDANSETRON HCL 4 MG PO TABS: 4.0000 mg | ORAL_TABLET | Freq: Three times a day (TID) | ORAL | Status: DC | PRN

## 2016-06-01 MED ORDER — LIDOCAINE HCL (CARDIAC) 20 MG/ML IV SOLN
INTRAVENOUS | Status: DC | PRN
  Administered 2016-06-01: 50 mg via INTRAVENOUS

## 2016-06-01 MED ORDER — MIDAZOLAM HCL 2 MG/2ML IJ SOLN
1.0000 mg | INTRAMUSCULAR | Status: DC | PRN
  Administered 2016-06-01: 2 mg via INTRAVENOUS

## 2016-06-01 MED ORDER — PROPOFOL 10 MG/ML IV BOLUS
INTRAVENOUS | Status: DC | PRN
  Administered 2016-06-01: 200 mg via INTRAVENOUS

## 2016-06-01 MED ORDER — SCOPOLAMINE 1 MG/3DAYS TD PT72: 1.0000 | MEDICATED_PATCH | Freq: Once | TRANSDERMAL | Status: DC | PRN

## 2016-06-01 MED ORDER — PHENYLEPHRINE 40 MCG/ML (10ML) SYRINGE FOR IV PUSH (FOR BLOOD PRESSURE SUPPORT)
PREFILLED_SYRINGE | INTRAVENOUS | Status: AC
  Filled 2016-06-01: qty 10

## 2016-06-01 SURGICAL SUPPLY — 52 items
BAG DECANTER FOR FLEXI CONT (MISCELLANEOUS) IMPLANT
BANDAGE ACE 4X5 VEL STRL LF (GAUZE/BANDAGES/DRESSINGS) IMPLANT
BANDAGE ACE 6X5 VEL STRL LF (GAUZE/BANDAGES/DRESSINGS) ×2 IMPLANT
BANDAGE ESMARK 6X9 LF (GAUZE/BANDAGES/DRESSINGS) ×1 IMPLANT
BLADE SURG 15 STRL LF DISP TIS (BLADE) ×1 IMPLANT
BLADE SURG 15 STRL SS (BLADE) ×1
BNDG ESMARK 6X9 LF (GAUZE/BANDAGES/DRESSINGS) ×2
CLSR STERI-STRIP ANTIMIC 1/2X4 (GAUZE/BANDAGES/DRESSINGS) IMPLANT
CUFF TOURNIQUET SINGLE 34IN LL (TOURNIQUET CUFF) ×2 IMPLANT
DECANTER SPIKE VIAL GLASS SM (MISCELLANEOUS) IMPLANT
DRAPE EXTREMITY T 121X128X90 (DRAPE) ×2 IMPLANT
DRAPE IMP U-DRAPE 54X76 (DRAPES) IMPLANT
DRAPE U-SHAPE 47X51 STRL (DRAPES) ×2 IMPLANT
DRSG MEPILEX BORDER 4X8 (GAUZE/BANDAGES/DRESSINGS) ×2 IMPLANT
DURAPREP 26ML APPLICATOR (WOUND CARE) ×2 IMPLANT
ELECT REM PT RETURN 9FT ADLT (ELECTROSURGICAL) ×2
ELECTRODE REM PT RTRN 9FT ADLT (ELECTROSURGICAL) ×1 IMPLANT
GAUZE SPONGE 4X4 12PLY STRL (GAUZE/BANDAGES/DRESSINGS) ×2 IMPLANT
GAUZE XEROFORM 1X8 LF (GAUZE/BANDAGES/DRESSINGS) IMPLANT
GLOVE BIO SURGEON STRL SZ8 (GLOVE) ×4 IMPLANT
GLOVE BIOGEL PI IND STRL 7.0 (GLOVE) ×2 IMPLANT
GLOVE BIOGEL PI IND STRL 8 (GLOVE) ×2 IMPLANT
GLOVE BIOGEL PI INDICATOR 7.0 (GLOVE) ×2
GLOVE BIOGEL PI INDICATOR 8 (GLOVE) ×2
GLOVE ECLIPSE 6.5 STRL STRAW (GLOVE) ×2 IMPLANT
GLOVE ORTHO TXT STRL SZ7.5 (GLOVE) ×2 IMPLANT
GOWN STRL REUS W/ TWL LRG LVL3 (GOWN DISPOSABLE) ×1 IMPLANT
GOWN STRL REUS W/ TWL XL LVL3 (GOWN DISPOSABLE) ×2 IMPLANT
GOWN STRL REUS W/TWL LRG LVL3 (GOWN DISPOSABLE) ×1
GOWN STRL REUS W/TWL XL LVL3 (GOWN DISPOSABLE) ×2
NS IRRIG 1000ML POUR BTL (IV SOLUTION) ×2 IMPLANT
PACK ARTHROSCOPY DSU (CUSTOM PROCEDURE TRAY) ×2 IMPLANT
PACK BASIN DAY SURGERY FS (CUSTOM PROCEDURE TRAY) ×2 IMPLANT
PADDING CAST COTTON 6X4 STRL (CAST SUPPLIES) IMPLANT
PENCIL BUTTON HOLSTER BLD 10FT (ELECTRODE) ×2 IMPLANT
SLEEVE SCD COMPRESS KNEE MED (MISCELLANEOUS) ×2 IMPLANT
SPONGE LAP 4X18 X RAY DECT (DISPOSABLE) ×2 IMPLANT
STAPLER VISISTAT (STAPLE) IMPLANT
STAPLER VISISTAT 35W (STAPLE) IMPLANT
SUT MON AB 4-0 PC3 18 (SUTURE) IMPLANT
SUT VIC AB 0 CT1 27 (SUTURE) ×1
SUT VIC AB 0 CT1 27XBRD ANBCTR (SUTURE) ×1 IMPLANT
SUT VIC AB 0 SH 27 (SUTURE) IMPLANT
SUT VIC AB 2-0 SH 27 (SUTURE) ×1
SUT VIC AB 2-0 SH 27XBRD (SUTURE) ×1 IMPLANT
SUT VIC AB 3-0 SH 27 (SUTURE)
SUT VIC AB 3-0 SH 27X BRD (SUTURE) IMPLANT
SUT VICRYL 3-0 CR8 SH (SUTURE) ×2 IMPLANT
SYR 3ML LL SCALE MARK (SYRINGE) IMPLANT
SYR BULB 3OZ (MISCELLANEOUS) ×2 IMPLANT
TOWEL OR 17X24 6PK STRL BLUE (TOWEL DISPOSABLE) ×2 IMPLANT
UNDERPAD 30X30 (UNDERPADS AND DIAPERS) IMPLANT

## 2016-06-01 NOTE — Discharge Instructions (Signed)
Diet: As you were doing prior to hospitalization  ° °Shower:  May shower but keep the wounds dry, use an occlusive plastic wrap, NO SOAKING IN TUB.  If the bandage gets wet, change with a clean dry gauze.  If you have a splint on, leave the splint in place and keep the splint dry with a plastic bag. ° °Dressing:  You may change your dressing 3-5 days after surgery, unless you have a splint.  If you have a splint, then just leave the splint in place and we will change your bandages during your first follow-up appointment.   ° °If you had hand or foot surgery, we will plan to remove your stitches in about 2 weeks in the office.  For all other surgeries, there are sticky tapes (steri-strips) on your wounds and all the stitches are absorbable.  Leave the steri-strips in place when changing your dressings, they will peel off with time, usually 2-3 weeks. ° °Activity:  Increase activity slowly as tolerated, but follow the weight bearing instructions below.  The rules on driving is that you can not be taking narcotics while you drive, and you must feel in control of the vehicle.   ° °Weight Bearing:   As tolerated.   ° °To prevent constipation: you may use a stool softener such as - ° °Colace (over the counter) 100 mg by mouth twice a day  °Drink plenty of fluids (prune juice may be helpful) and high fiber foods °Miralax (over the counter) for constipation as needed.   ° °Itching:  If you experience itching with your medications, try taking only a single pain pill, or even half a pain pill at a time.  You may take up to 10 pain pills per day, and you can also use benadryl over the counter for itching or also to help with sleep.  ° °Precautions:  If you experience chest pain or shortness of breath - call 911 immediately for transfer to the hospital emergency department!! ° °If you develop a fever greater that 101 F, purulent drainage from wound, increased redness or drainage from wound, or calf pain -- Call the office at  [phone]                                                °Follow- Up Appointment:  Please call for an appointment to be seen in 2 weeks [place] - [phone] ° ° ° ° ° °Post Anesthesia Home Care Instructions ° °Activity: °Get plenty of rest for the remainder of the day. A responsible adult should stay with you for 24 hours following the procedure.  °For the next 24 hours, DO NOT: °-Drive a car °-Operate machinery °-Drink alcoholic beverages °-Take any medication unless instructed by your physician °-Make any legal decisions or sign important papers. ° °Meals: °Start with liquid foods such as gelatin or soup. Progress to regular foods as tolerated. Avoid greasy, spicy, heavy foods. If nausea and/or vomiting occur, drink only clear liquids until the nausea and/or vomiting subsides. Call your physician if vomiting continues. ° °Special Instructions/Symptoms: °Your throat may feel dry or sore from the anesthesia or the breathing tube placed in your throat during surgery. If this causes discomfort, gargle with warm salt water. The discomfort should disappear within 24 hours. ° °If you had a scopolamine patch placed behind your ear for the management   of post- operative nausea and/or vomiting: ° °1. The medication in the patch is effective for 72 hours, after which it should be removed.  Wrap patch in a tissue and discard in the trash. Wash hands thoroughly with soap and water. °2. You may remove the patch earlier than 72 hours if you experience unpleasant side effects which may include dry mouth, dizziness or visual disturbances. °3. Avoid touching the patch. Wash your hands with soap and water after contact with the patch. °  ° °

## 2016-06-01 NOTE — Anesthesia Postprocedure Evaluation (Signed)
Anesthesia Post Note  Patient: Theresa Cuevas  Procedure(s) Performed: Procedure(s) (LRB): LEFT LOWER LEG FASCIOTOMY (Left)  Patient location during evaluation: PACU Anesthesia Type: General Level of consciousness: awake and alert Pain management: pain level controlled Vital Signs Assessment: post-procedure vital signs reviewed and stable Respiratory status: spontaneous breathing, nonlabored ventilation and respiratory function stable Cardiovascular status: blood pressure returned to baseline and stable Postop Assessment: no signs of nausea or vomiting Anesthetic complications: no    Last Vitals:  Filed Vitals:   06/01/16 1130 06/01/16 1137  BP: 112/84   Pulse: 60 59  Temp:    Resp: 8 13    Last Pain:  Filed Vitals:   06/01/16 1140  PainSc: 5                  Mieshia Pepitone A

## 2016-06-01 NOTE — Op Note (Signed)
06/01/2016  10:33 AM  PATIENT:  Theresa Cuevas    PRE-OPERATIVE DIAGNOSIS: left leg exertional compartment syndrome  POST-OPERATIVE DIAGNOSIS:  Same  PROCEDURE:  LEFT LOWER LEG FASCIOTOMY, ANTERIOR AND LATERAL COMPARTMENTS  SURGEON:  Eulas PostLANDAU,Keyarah Mcroy P, MD  PHYSICIAN ASSISTANT: Janace LittenBrandon Parry, OPA-C, present and scrubbed throughout the case, critical for completion in a timely fashion, and for retraction, instrumentation, and closure.  ANESTHESIA:   General  PREOPERATIVE INDICATIONS:  Theresa GageHeather K Goebel is a  45 y.o. female who had exertional left leg compartment syndrome diagnosed in the office with compartment measurements who elected for surgical management. Her pain restricted her ability to work out and do exercise, and limited her function.  The risks benefits and alternatives were discussed with the patient preoperatively including but not limited to the risks of infection, bleeding, nerve injury, cardiopulmonary complications, the need for revision surgery, among others, and the patient was willing to proceed. We specifically discussed the risks for injury to the superficial peroneal nerve, as well as recurrent pain and incomplete relief of symptoms.  OPERATIVE IMPLANTS: None  OPERATIVE FINDINGS: There was a fairly substantial bulge both anteriorly and laterally at the junction of the intermuscular septum, that was actually fairly remarkable. For this reason I did release both the anterior as well as the lateral compartment. I visualized the intermuscular septum quite easily, and gently explored deep within the compartment laterally to visualize the nerve.  OPERATIVE PROCEDURE: The patient was brought to the operating room and placed in supine position. General anesthesia was blistered. IV antibiotics were not given because of her allergy list, and the fact that we were not using any implants and this is purely a soft tissue procedure and the risks of antibiotic administration  outweigh the benefits.  The left lower extremity was prepped and draped in usual sterile fashion. Time out performed. The leg was elevated and exsanguinated and a tourniquet was inflated. A medium-sized incision was made over the lateral aspect of the leg mid junction between the tibial crest and the fibula, and dissection carried down to the fascia. The intermuscular septum identified and a transverse incision was made exposing the anterior and the lateral compartment. I confirmed position of the compartment by dorsiflexion and plantarflexion and inversion and eversion appreciating the corresponding passive muscular stretch.  I dissected along the subcutaneous tissue above and below the fascia using a long scissors, both proximally and distally as well as on the posterior aspect proximally and distally. I took care to keep the tips aimed anteriorly as I move distally moving away from the septum on the anterior release, and switched the tips going posteriorly along the lateral release. I aimed towards the medial malleolus along the anterior release and then towards the fibular head laterally. I used the same technique proximally going towards the tibial tubercle and towards the fibular head. Complete fasciotomy was achieved, and standard technique observed to minimize risk to the superficial peroneal nerve.  The wounds irrigated copiously, and the subtendinous tissue closed with Vicryl followed by Steri-Strips and sterile gauze for the skin. She was awakened and returned to the PACU in stable and satisfactory condition. There were no complications and she tolerated the procedure well.

## 2016-06-01 NOTE — Anesthesia Preprocedure Evaluation (Signed)
Anesthesia Evaluation  Patient identified by MRN, date of birth, ID band Patient awake    Reviewed: Allergy & Precautions, NPO status , Patient's Chart, lab work & pertinent test results  History of Anesthesia Complications (+) PONV  Airway Mallampati: I  TM Distance: >3 FB Neck ROM: Full    Dental  (+) Teeth Intact, Dental Advisory Given   Pulmonary    breath sounds clear to auscultation       Cardiovascular  Rhythm:Regular Rate:Normal     Neuro/Psych    GI/Hepatic GERD  Medicated and Controlled,  Endo/Other  Hypothyroidism   Renal/GU      Musculoskeletal   Abdominal   Peds  Hematology   Anesthesia Other Findings   Reproductive/Obstetrics                             Anesthesia Physical Anesthesia Plan  ASA: II  Anesthesia Plan: General   Post-op Pain Management:    Induction: Intravenous  Airway Management Planned: LMA  Additional Equipment:   Intra-op Plan:   Post-operative Plan: Extubation in OR  Informed Consent: I have reviewed the patients History and Physical, chart, labs and discussed the procedure including the risks, benefits and alternatives for the proposed anesthesia with the patient or authorized representative who has indicated his/her understanding and acceptance.   Dental advisory given  Plan Discussed with: CRNA, Anesthesiologist and Surgeon  Anesthesia Plan Comments:         Anesthesia Quick Evaluation

## 2016-06-01 NOTE — Anesthesia Procedure Notes (Signed)
Procedure Name: LMA Insertion Date/Time: 06/01/2016 10:05 AM Performed by: Zenia ResidesPAYNE, Marciano Mundt D Pre-anesthesia Checklist: Patient identified, Emergency Drugs available, Suction available and Patient being monitored Patient Re-evaluated:Patient Re-evaluated prior to inductionOxygen Delivery Method: Circle system utilized Preoxygenation: Pre-oxygenation with 100% oxygen Intubation Type: IV induction Ventilation: Mask ventilation without difficulty LMA: LMA inserted LMA Size: 4.0 Number of attempts: 1 Airway Equipment and Method: Bite block Placement Confirmation: positive ETCO2 Tube secured with: Tape Dental Injury: Teeth and Oropharynx as per pre-operative assessment

## 2016-06-01 NOTE — H&P (Signed)
PREOPERATIVE H&P  Chief Complaint: Left leg exertional compartment syndrome  HPI: Theresa Cuevas is a 45 y.o. female who presents for preoperative history and physical with a diagnosis of left leg exertional compartment syndrome Symptoms are rated as moderate to severe, and have been worsening.  This is significantly impairing activities of daily living.  She has elected for surgical management. She developed symptoms with any kind of running, the right side is actually worse than the left, but she has diagnostic compartment pressures consistent with exertional compartment syndrome on both legs, and she has elected to perform the left side first. She needs to drive later this weekend. She denies any lateral symptoms, and no posterior symptoms, all anterior. Pain gets worse with power walking. She is a former Engineer, maintenance (IT)gymnast.  Past Medical History  Diagnosis Date  . PONV (postoperative nausea and vomiting)   . GERD (gastroesophageal reflux disease)     occasional-uses otc tagamet  . Fluid retention     on hctz  . Hypothyroidism   . Asthma     exercise induced  . Anxiety     and OCD  . Compartment syndrome of right lower extremity [facility](HCC)    Past Surgical History  Procedure Laterality Date  . Tubal ligation    . Dilation and curettage of uterus      ablation  . Laparoscopic assisted vaginal hysterectomy  11/23/2011    Procedure: LAPAROSCOPIC ASSISTED VAGINAL HYSTERECTOMY;  Surgeon: Jeani HawkingMichelle L Grewal, MD;  Location: [facility];  Service: Gynecology;  Laterality: N/A;  . Perineoplasty  11/23/2011    Procedure: PERINEOPLASTY;  Surgeon: Jeani HawkingMichelle L Grewal, MD;  Location: [facility];  Service: Gynecology;  Laterality: N/A;  . Endometrial ablation     Social History   Social History  . Marital Status: Married    Spouse Name: N/A  . Number of Children: N/A  . Years of Education: N/A   Social History Main Topics  . Smoking status: Never Smoker   . Smokeless tobacco: None  . Alcohol Use: No  . Drug  Use: No  . Sexual Activity: Yes    Birth Control/ Protection: Surgical   Other Topics Concern  . None   Social History Narrative   History reviewed. No pertinent family history. Allergies  Allergen Reactions  . Amoxicillin Anaphylaxis  . Penicillins Hives  . Prednisone Other (See Comments)    Makes her feel like her skin is crawling   Prior to Admission medications   Medication Sig Start Date End Date Taking? Authorizing Provider  acyclovir (ZOVIRAX) 800 MG tablet Take 800 mg by mouth 5 (five) times daily.   Yes Historical Provider, MD  amphetamine-dextroamphetamine (ADDERALL) 30 MG tablet Take 30 mg by mouth daily.   Yes Historical Provider, MD  fluvoxaMINE (LUVOX) 100 MG tablet Take 100 mg by mouth at bedtime.   Yes Historical Provider, MD  NALTREXONE HCL PO Take 4 mg by mouth.   Yes Historical Provider, MD  progesterone (PROMETRIUM) 200 MG capsule Take 600 mg by mouth daily.   Yes Historical Provider, MD  Thyroid (NATURE-THROID) 81.25 MG TABS Take by mouth.   Yes Historical Provider, MD     Positive ROS: All other systems have been reviewed and were otherwise negative with the exception of those mentioned in the HPI and as above.  Physical Exam: General: Alert, no acute distress Cardiovascular: No pedal edema Respiratory: No cyanosis, no use of accessory musculature GI: No organomegaly, abdomen is soft and non-tender Skin: No lesions in  the area of chief complaint Neurologic: Sensation intact distally Psychiatric: Patient is competent for consent with normal mood and affect Lymphatic: No axillary or cervical lymphadenopathy  MUSCULOSKELETAL: Bilateral legs have sensation intact throughout, dorsiflexion and plantarflexion of the toes are intact as well as the ankle, compartment pressures tested in the office demonstrated 110 no meter of mercury on the right and 73 mmHg on the left. This was after exertion.  Assessment: Bilateral anterior compartment exertional compartment  syndrome   Plan: Plan for Procedure(s): LEFT LOWER LEG FASCIOTOMY  The risks benefits and alternatives were discussed with the patient including but not limited to the risks of nonoperative treatment, versus surgical intervention including infection, bleeding, nerve injury,  blood clots, cardiopulmonary complications, morbidity, mortality, among others, and they were willing to proceed. We've specifically discussed the risks for incomplete relief of symptoms, as well as damage to the superficial peroneal nerve.  Eulas Post, MD Cell [phone]   06/01/2016 9:27 AM

## 2016-06-01 NOTE — Transfer of Care (Signed)
Immediate Anesthesia Transfer of Care Note  Patient: Theresa Cuevas  Procedure(s) Performed: Procedure(s): LEFT LOWER LEG FASCIOTOMY (Left)  Patient Location: PACU  Anesthesia Type:General  Level of Consciousness: awake and alert   Airway & Oxygen Therapy: Patient Spontanous Breathing and Patient connected to face mask oxygen  Post-op Assessment: Report given to RN and Post -op Vital signs reviewed and stable  Post vital signs: Reviewed and stable  Last Vitals:  Filed Vitals:   06/01/16 0928  BP: 116/79  Pulse: 65  Temp: 36.6 C  Resp: 16    Last Pain: There were no vitals filed for this visit.       Complications: No apparent anesthesia complications

## 2016-06-02 ENCOUNTER — Encounter (HOSPITAL_BASED_OUTPATIENT_CLINIC_OR_DEPARTMENT_OTHER): Payer: Self-pay | Admitting: Orthopedic Surgery

## 2017-01-21 ENCOUNTER — Other Ambulatory Visit (HOSPITAL_COMMUNITY)
Admission: RE | Admit: 2017-01-21 | Discharge: 2017-01-21 | Disposition: A | Discharge status: Home / Self Care | Payer: BLUE CROSS/BLUE SHIELD | Source: Ambulatory Visit | Attending: Cardiology | Admitting: Cardiology

## 2017-01-21 ENCOUNTER — Other Ambulatory Visit (HOSPITAL_COMMUNITY)
Admission: AD | Admit: 2017-01-21 | Discharge: 2017-01-21 | Disposition: A | Discharge status: Home / Self Care | Payer: BLUE CROSS/BLUE SHIELD | Source: Ambulatory Visit | Attending: Cardiology | Admitting: Cardiology

## 2017-01-21 DIAGNOSIS — T148XXA Other injury of unspecified body region, initial encounter: Secondary | ICD-10-CM | POA: Insufficient documentation

## 2017-01-21 DIAGNOSIS — R002 Palpitations: Secondary | ICD-10-CM | POA: Insufficient documentation

## 2017-01-24 LAB — CATECHOLAMINES, FRACTIONATED, PLASMA
Dopamine: <30 pg/mL (ref 0–48)
Epinephrine: <15 pg/mL (ref 0–62)
Epinephrine: <15 pg/mL (ref 0–62)
NOREPINEPHRINE: 210 pg/mL (ref 0–874)
NOREPINEPHRINE: 529 pg/mL (ref 0–874)

## 2017-02-03 LAB — MISC LABCORP TEST (SEND OUT): LABCORP TEST CODE: 9985

## 2017-03-03 LAB — MISC LABCORP TEST (SEND OUT): Labcorp test code: 9985

## 2018-04-13 DIAGNOSIS — F411 Generalized anxiety disorder: Secondary | ICD-10-CM | POA: Diagnosis not present

## 2018-04-13 DIAGNOSIS — F429 Obsessive-compulsive disorder, unspecified: Secondary | ICD-10-CM | POA: Diagnosis not present

## 2018-05-04 DIAGNOSIS — Z1231 Encounter for screening mammogram for malignant neoplasm of breast: Secondary | ICD-10-CM | POA: Diagnosis not present

## 2018-05-04 DIAGNOSIS — Z01419 Encounter for gynecological examination (general) (routine) without abnormal findings: Secondary | ICD-10-CM | POA: Diagnosis not present

## 2018-05-04 DIAGNOSIS — Z6828 Body mass index (BMI) 28.0-28.9, adult: Secondary | ICD-10-CM | POA: Diagnosis not present

## 2018-06-15 DIAGNOSIS — F429 Obsessive-compulsive disorder, unspecified: Secondary | ICD-10-CM | POA: Diagnosis not present

## 2018-06-15 DIAGNOSIS — F411 Generalized anxiety disorder: Secondary | ICD-10-CM | POA: Diagnosis not present

## 2018-08-03 DIAGNOSIS — F411 Generalized anxiety disorder: Secondary | ICD-10-CM | POA: Diagnosis not present

## 2018-08-03 DIAGNOSIS — F429 Obsessive-compulsive disorder, unspecified: Secondary | ICD-10-CM | POA: Diagnosis not present

## 2018-11-29 DIAGNOSIS — F429 Obsessive-compulsive disorder, unspecified: Secondary | ICD-10-CM | POA: Diagnosis not present

## 2018-11-29 DIAGNOSIS — F411 Generalized anxiety disorder: Secondary | ICD-10-CM | POA: Diagnosis not present

## 2019-02-27 DIAGNOSIS — F411 Generalized anxiety disorder: Secondary | ICD-10-CM | POA: Diagnosis not present

## 2019-02-27 DIAGNOSIS — F429 Obsessive-compulsive disorder, unspecified: Secondary | ICD-10-CM | POA: Diagnosis not present

## 2019-05-03 DIAGNOSIS — F411 Generalized anxiety disorder: Secondary | ICD-10-CM | POA: Diagnosis not present

## 2019-05-03 DIAGNOSIS — F429 Obsessive-compulsive disorder, unspecified: Secondary | ICD-10-CM | POA: Diagnosis not present

## 2019-05-31 DIAGNOSIS — L814 Other melanin hyperpigmentation: Secondary | ICD-10-CM | POA: Diagnosis not present

## 2019-08-15 DIAGNOSIS — F411 Generalized anxiety disorder: Secondary | ICD-10-CM | POA: Diagnosis not present

## 2019-08-15 DIAGNOSIS — F429 Obsessive-compulsive disorder, unspecified: Secondary | ICD-10-CM | POA: Diagnosis not present

## 2019-08-15 DIAGNOSIS — M25511 Pain in right shoulder: Secondary | ICD-10-CM | POA: Diagnosis not present

## 2019-08-23 DIAGNOSIS — M25512 Pain in left shoulder: Secondary | ICD-10-CM | POA: Diagnosis not present

## 2019-09-07 DIAGNOSIS — M25512 Pain in left shoulder: Secondary | ICD-10-CM | POA: Diagnosis not present

## 2019-09-18 DIAGNOSIS — F411 Generalized anxiety disorder: Secondary | ICD-10-CM | POA: Diagnosis not present

## 2019-09-18 DIAGNOSIS — F429 Obsessive-compulsive disorder, unspecified: Secondary | ICD-10-CM | POA: Diagnosis not present

## 2019-09-20 DIAGNOSIS — M25511 Pain in right shoulder: Secondary | ICD-10-CM | POA: Diagnosis not present

## 2019-09-25 DIAGNOSIS — Z23 Encounter for immunization: Secondary | ICD-10-CM | POA: Diagnosis not present

## 2019-09-25 DIAGNOSIS — M25512 Pain in left shoulder: Secondary | ICD-10-CM | POA: Diagnosis not present

## 2019-09-25 DIAGNOSIS — S43431D Superior glenoid labrum lesion of right shoulder, subsequent encounter: Secondary | ICD-10-CM | POA: Diagnosis not present

## 2019-11-01 DIAGNOSIS — F429 Obsessive-compulsive disorder, unspecified: Secondary | ICD-10-CM | POA: Diagnosis not present

## 2019-11-01 DIAGNOSIS — F411 Generalized anxiety disorder: Secondary | ICD-10-CM | POA: Diagnosis not present

## 2019-12-24 DIAGNOSIS — Y999 Unspecified external cause status: Secondary | ICD-10-CM | POA: Diagnosis not present

## 2019-12-24 DIAGNOSIS — X58XXXA Exposure to other specified factors, initial encounter: Secondary | ICD-10-CM | POA: Diagnosis not present

## 2019-12-24 DIAGNOSIS — S46011A Strain of muscle(s) and tendon(s) of the rotator cuff of right shoulder, initial encounter: Secondary | ICD-10-CM | POA: Diagnosis not present

## 2019-12-24 DIAGNOSIS — M94211 Chondromalacia, right shoulder: Secondary | ICD-10-CM | POA: Diagnosis not present

## 2019-12-24 DIAGNOSIS — S43431A Superior glenoid labrum lesion of right shoulder, initial encounter: Secondary | ICD-10-CM | POA: Diagnosis not present

## 2019-12-24 DIAGNOSIS — G8918 Other acute postprocedural pain: Secondary | ICD-10-CM | POA: Diagnosis not present

## 2019-12-27 DIAGNOSIS — M25511 Pain in right shoulder: Secondary | ICD-10-CM | POA: Diagnosis not present

## 2020-01-07 DIAGNOSIS — M25511 Pain in right shoulder: Secondary | ICD-10-CM | POA: Diagnosis not present

## 2020-01-11 DIAGNOSIS — M25511 Pain in right shoulder: Secondary | ICD-10-CM | POA: Diagnosis not present

## 2020-01-14 DIAGNOSIS — M25511 Pain in right shoulder: Secondary | ICD-10-CM | POA: Diagnosis not present

## 2020-01-17 DIAGNOSIS — M25511 Pain in right shoulder: Secondary | ICD-10-CM | POA: Diagnosis not present

## 2020-01-22 DIAGNOSIS — M25511 Pain in right shoulder: Secondary | ICD-10-CM | POA: Diagnosis not present

## 2020-01-24 DIAGNOSIS — M25511 Pain in right shoulder: Secondary | ICD-10-CM | POA: Diagnosis not present

## 2020-01-29 DIAGNOSIS — M25511 Pain in right shoulder: Secondary | ICD-10-CM | POA: Diagnosis not present

## 2020-01-31 DIAGNOSIS — M25511 Pain in right shoulder: Secondary | ICD-10-CM | POA: Diagnosis not present

## 2020-02-05 DIAGNOSIS — M25511 Pain in right shoulder: Secondary | ICD-10-CM | POA: Diagnosis not present

## 2020-02-08 DIAGNOSIS — M25511 Pain in right shoulder: Secondary | ICD-10-CM | POA: Diagnosis not present

## 2020-02-12 DIAGNOSIS — M25511 Pain in right shoulder: Secondary | ICD-10-CM | POA: Diagnosis not present

## 2020-02-14 DIAGNOSIS — M25511 Pain in right shoulder: Secondary | ICD-10-CM | POA: Diagnosis not present

## 2020-02-20 DIAGNOSIS — F429 Obsessive-compulsive disorder, unspecified: Secondary | ICD-10-CM | POA: Diagnosis not present

## 2020-02-20 DIAGNOSIS — F411 Generalized anxiety disorder: Secondary | ICD-10-CM | POA: Diagnosis not present

## 2020-02-26 DIAGNOSIS — M25511 Pain in right shoulder: Secondary | ICD-10-CM | POA: Diagnosis not present

## 2020-02-27 DIAGNOSIS — Z0000 Encounter for general adult medical examination without abnormal findings: Secondary | ICD-10-CM | POA: Diagnosis not present

## 2020-02-28 DIAGNOSIS — M25511 Pain in right shoulder: Secondary | ICD-10-CM | POA: Diagnosis not present

## 2020-03-04 DIAGNOSIS — M25511 Pain in right shoulder: Secondary | ICD-10-CM | POA: Diagnosis not present

## 2020-03-11 DIAGNOSIS — M25511 Pain in right shoulder: Secondary | ICD-10-CM | POA: Diagnosis not present

## 2020-03-13 DIAGNOSIS — M25511 Pain in right shoulder: Secondary | ICD-10-CM | POA: Diagnosis not present

## 2020-03-18 DIAGNOSIS — K5900 Constipation, unspecified: Secondary | ICD-10-CM | POA: Diagnosis not present

## 2020-03-18 DIAGNOSIS — R739 Hyperglycemia, unspecified: Secondary | ICD-10-CM | POA: Diagnosis not present

## 2020-03-18 DIAGNOSIS — E663 Overweight: Secondary | ICD-10-CM | POA: Diagnosis not present

## 2020-03-18 DIAGNOSIS — F39 Unspecified mood [affective] disorder: Secondary | ICD-10-CM | POA: Diagnosis not present

## 2020-03-20 DIAGNOSIS — M25511 Pain in right shoulder: Secondary | ICD-10-CM | POA: Diagnosis not present

## 2020-03-25 DIAGNOSIS — M25511 Pain in right shoulder: Secondary | ICD-10-CM | POA: Diagnosis not present

## 2020-03-27 DIAGNOSIS — M25511 Pain in right shoulder: Secondary | ICD-10-CM | POA: Diagnosis not present

## 2020-04-01 DIAGNOSIS — Z4789 Encounter for other orthopedic aftercare: Secondary | ICD-10-CM | POA: Diagnosis not present

## 2020-04-03 DIAGNOSIS — K5904 Chronic idiopathic constipation: Secondary | ICD-10-CM | POA: Diagnosis not present

## 2020-04-03 DIAGNOSIS — Z1211 Encounter for screening for malignant neoplasm of colon: Secondary | ICD-10-CM | POA: Diagnosis not present

## 2020-04-18 DIAGNOSIS — Z23 Encounter for immunization: Secondary | ICD-10-CM | POA: Diagnosis not present

## 2020-04-18 DIAGNOSIS — E039 Hypothyroidism, unspecified: Secondary | ICD-10-CM | POA: Diagnosis not present

## 2020-04-18 DIAGNOSIS — E663 Overweight: Secondary | ICD-10-CM | POA: Diagnosis not present

## 2020-04-18 DIAGNOSIS — F39 Unspecified mood [affective] disorder: Secondary | ICD-10-CM | POA: Diagnosis not present

## 2020-04-18 DIAGNOSIS — R944 Abnormal results of kidney function studies: Secondary | ICD-10-CM | POA: Diagnosis not present

## 2020-04-22 DIAGNOSIS — Z01419 Encounter for gynecological examination (general) (routine) without abnormal findings: Secondary | ICD-10-CM | POA: Diagnosis not present

## 2020-04-22 DIAGNOSIS — E349 Endocrine disorder, unspecified: Secondary | ICD-10-CM | POA: Diagnosis not present

## 2020-04-22 DIAGNOSIS — Z6828 Body mass index (BMI) 28.0-28.9, adult: Secondary | ICD-10-CM | POA: Diagnosis not present

## 2020-04-22 DIAGNOSIS — Z1231 Encounter for screening mammogram for malignant neoplasm of breast: Secondary | ICD-10-CM | POA: Diagnosis not present

## 2020-04-28 DIAGNOSIS — Z1159 Encounter for screening for other viral diseases: Secondary | ICD-10-CM | POA: Diagnosis not present

## 2020-04-30 DIAGNOSIS — K6389 Other specified diseases of intestine: Secondary | ICD-10-CM | POA: Diagnosis not present

## 2020-04-30 DIAGNOSIS — D125 Benign neoplasm of sigmoid colon: Secondary | ICD-10-CM | POA: Diagnosis not present

## 2020-04-30 DIAGNOSIS — K621 Rectal polyp: Secondary | ICD-10-CM | POA: Diagnosis not present

## 2020-04-30 DIAGNOSIS — K5900 Constipation, unspecified: Secondary | ICD-10-CM | POA: Diagnosis not present

## 2020-05-06 DIAGNOSIS — Z23 Encounter for immunization: Secondary | ICD-10-CM | POA: Diagnosis not present

## 2020-07-02 DIAGNOSIS — F411 Generalized anxiety disorder: Secondary | ICD-10-CM | POA: Diagnosis not present

## 2020-07-02 DIAGNOSIS — F429 Obsessive-compulsive disorder, unspecified: Secondary | ICD-10-CM | POA: Diagnosis not present

## 2020-07-29 DIAGNOSIS — N951 Menopausal and female climacteric states: Secondary | ICD-10-CM | POA: Diagnosis not present

## 2020-08-01 ENCOUNTER — Emergency Department (HOSPITAL_BASED_OUTPATIENT_CLINIC_OR_DEPARTMENT_OTHER)
Admission: EM | Admit: 2020-08-01 | Discharge: 2020-08-01 | Disposition: A | Discharge status: Home / Self Care | Payer: BC Managed Care – PPO | Attending: Emergency Medicine | Admitting: Emergency Medicine

## 2020-08-01 ENCOUNTER — Encounter (HOSPITAL_BASED_OUTPATIENT_CLINIC_OR_DEPARTMENT_OTHER): Payer: Self-pay

## 2020-08-01 ENCOUNTER — Other Ambulatory Visit: Payer: Self-pay

## 2020-08-01 DIAGNOSIS — J45909 Unspecified asthma, uncomplicated: Secondary | ICD-10-CM | POA: Insufficient documentation

## 2020-08-01 DIAGNOSIS — E039 Hypothyroidism, unspecified: Secondary | ICD-10-CM | POA: Insufficient documentation

## 2020-08-01 DIAGNOSIS — R21 Rash and other nonspecific skin eruption: Secondary | ICD-10-CM | POA: Insufficient documentation

## 2020-08-01 DIAGNOSIS — Z79899 Other long term (current) drug therapy: Secondary | ICD-10-CM | POA: Diagnosis not present

## 2020-08-01 DIAGNOSIS — T63441A Toxic effect of venom of bees, accidental (unintentional), initial encounter: Secondary | ICD-10-CM | POA: Diagnosis not present

## 2020-08-01 DIAGNOSIS — T7840XA Allergy, unspecified, initial encounter: Secondary | ICD-10-CM | POA: Insufficient documentation

## 2020-08-01 MED ORDER — IBUPROFEN 400 MG PO TABS
600.0000 mg | ORAL_TABLET | Freq: Once | ORAL | Status: AC
  Administered 2020-08-01: 600 mg via ORAL
  Filled 2020-08-01: qty 1

## 2020-08-01 MED ORDER — SODIUM CHLORIDE 0.9 % IV BOLUS
1000.0000 mL | Freq: Once | INTRAVENOUS | Status: AC
  Administered 2020-08-01: 1000 mL via INTRAVENOUS

## 2020-08-01 MED ORDER — FAMOTIDINE 20 MG PO TABS: 20.0000 mg | ORAL_TABLET | Freq: Two times a day (BID) | ORAL | 0 refills | Status: DC

## 2020-08-01 MED ORDER — EPINEPHRINE 0.3 MG/0.3ML IJ SOAJ: 0.3000 mg | INTRAMUSCULAR | 0 refills | Status: AC | PRN

## 2020-08-01 MED ORDER — DIPHENHYDRAMINE HCL 50 MG/ML IJ SOLN
12.5000 mg | Freq: Once | INTRAMUSCULAR | Status: AC
  Administered 2020-08-01: 12.5 mg via INTRAVENOUS
  Filled 2020-08-01: qty 1

## 2020-08-01 MED ORDER — FAMOTIDINE IN NACL 20-0.9 MG/50ML-% IV SOLN
20.0000 mg | Freq: Once | INTRAVENOUS | Status: AC
  Administered 2020-08-01: 20 mg via INTRAVENOUS
  Filled 2020-08-01: qty 50

## 2020-08-01 NOTE — ED Provider Notes (Signed)
[facility] EMERGENCY DEPARTMENT Provider Note   CSN: [id number] Arrival date & time: 08/01/20  1314     History Chief Complaint  Patient presents with  . Allergic Reaction    Theresa Cuevas is a 49 y.o. female who presents for evaluation of allergic reaction and insect bite to right foot.  She reports that about 9-10 AM this morning, she was stung by either a yellowjacket or hornet on her lateral aspect of her right foot.  She states that she started breaking out into hives and feeling like foot and leg were getting swollen.  She took 12.5 milligrams Benadryl x3 and went to urgent care.  At urgent care, she was given an EpiPen for further management of her symptoms and was told to go to the emergency department.  On initially arrival, she does feel like the swelling and pain in her foot has improved though she still feels some irritation.  She states her hives are starting better as well.  She reports that initially, she felt panicked and felt like she was having to take deep breaths.  She never had any swelling of her tongue or her lips.  She states that that panic and breathing feeling has since resolved.  She denies any chest pain, difficulty breathing, swelling of her tongue or lips.  No new lotions, soaps, detergents.  The history is provided by the patient.       Past Medical History:  Diagnosis Date  . Anxiety    and OCD  . Asthma    exercise induced  . Compartment syndrome of right lower extremity (HCC)   . Exertional compartment syndrome of lower extremity, left 06/01/2016  . Fluid retention    on hctz  . GERD (gastroesophageal reflux disease)    occasional-uses otc tagamet  . Hypothyroidism   . PONV (postoperative nausea and vomiting)     Patient Active Problem List   Diagnosis Date Noted  . Exertional compartment syndrome of lower extremity, left 06/01/2016    Past Surgical History:  Procedure Laterality Date  . ABDOMINAL HYSTERECTOMY    .  DILATION AND CURETTAGE OF UTERUS     ablation  . ENDOMETRIAL ABLATION    . FASCIOTOMY Left 06/01/2016   Procedure: LEFT LOWER LEG FASCIOTOMY;  Surgeon: Teryl LucyJoshua Landau, MD;  Location: [facility];  Service: Orthopedics;  Laterality: Left;  . LAPAROSCOPIC ASSISTED VAGINAL HYSTERECTOMY  11/23/2011   Procedure: LAPAROSCOPIC ASSISTED VAGINAL HYSTERECTOMY;  Surgeon: Jeani HawkingMichelle L Grewal, MD;  Location: [facility];  Service: Gynecology;  Laterality: N/A;  . PERINEOPLASTY  11/23/2011   Procedure: PERINEOPLASTY;  Surgeon: Jeani HawkingMichelle L Grewal, MD;  Location: [facility];  Service: Gynecology;  Laterality: N/A;  . ROTATOR CUFF REPAIR    . TUBAL LIGATION       OB History   No obstetric history on file.     No family history on file.  Social History   Tobacco Use  . Smoking status: Never Smoker  . Smokeless tobacco: Never Used  Vaping Use  . Vaping Use: Never used  Substance Use Topics  . Alcohol use: No  . Drug use: No    Home Medications Prior to Admission medications   Medication Sig Start Date End Date Taking? Authorizing Provider  acyclovir (ZOVIRAX) 800 MG tablet Take 800 mg by mouth 5 (five) times daily.    [provider]  amphetamine-dextroamphetamine (ADDERALL) 30 MG tablet Take 30 mg by mouth daily.    [provider]  baclofen (LIORESAL) 10 MG tablet Take 1 tablet (10 mg total) by mouth 3 (three) times daily. As needed for muscle spasm 06/01/16   Teryl Lucy, MD  EPINEPHrine 0.3 mg/0.3 mL IJ SOAJ injection Inject 0.3 mLs (0.3 mg total) into the muscle as needed for anaphylaxis. 08/01/20   Maxwell Caul, PA-C  famotidine (PEPCID) 20 MG tablet Take 1 tablet (20 mg total) by mouth 2 (two) times daily for 4 days. 08/01/20 08/05/20  Maxwell Caul, PA-C  fluvoxaMINE (LUVOX) 100 MG tablet Take 100 mg by mouth at bedtime.    [provider]  HYDROcodone-acetaminophen (NORCO) 5-325 MG tablet Take 1-2 tablets by mouth every 6 (six) hours as needed for  moderate pain. MAXIMUM TOTAL ACETAMINOPHEN DOSE IS 4000 MG PER DAY 06/01/16   Teryl Lucy, MD  NALTREXONE HCL PO Take 4 mg by mouth.    [provider]  ondansetron (ZOFRAN) 4 MG tablet Take 1 tablet (4 mg total) by mouth every 8 (eight) hours as needed for nausea or vomiting. 06/01/16   Teryl Lucy, MD  progesterone (PROMETRIUM) 200 MG capsule Take 600 mg by mouth daily.    [provider]  sennosides-docusate sodium (SENOKOT-S) 8.6-50 MG tablet Take 2 tablets by mouth daily. 06/01/16   Teryl Lucy, MD  Thyroid (NATURE-THROID) 81.25 MG TABS Take by mouth.    [provider]    Allergies    Amoxicillin, Penicillins, and Prednisone  Review of Systems   Review of Systems  Constitutional: Negative for fever.  HENT: Negative for facial swelling.   Respiratory: Negative for cough and shortness of breath.   Cardiovascular: Negative for chest pain.  Gastrointestinal: Negative for abdominal pain, nausea and vomiting.  Musculoskeletal:       Foot pain  Skin: Positive for color change and rash.  Neurological: Negative for weakness and numbness.  All other systems reviewed and are negative.   Physical Exam Updated Vital Signs BP 118/64   Pulse 64   Temp 98 F (36.7 C) (Oral)   Resp 16   LMP 11/18/2011   SpO2 100%   Physical Exam Vitals and nursing note reviewed.  Constitutional:      Appearance: Normal appearance. She is well-developed.  HENT:     Head: Normocephalic and atraumatic.     Comments: No oral angioedema.  Airways patent, patient is intact.  Uvula is midline. Eyes:     General: Lids are normal.     Conjunctiva/sclera: Conjunctivae normal.     Pupils: Pupils are equal, round, and reactive to light.  Cardiovascular:     Rate and Rhythm: Normal rate and regular rhythm.     Pulses: Normal pulses.          Dorsalis pedis pulses are 2+ on the right side and 2+ on the left side.     Heart sounds: Normal heart sounds. No murmur heard.  No  friction rub. No gallop.   Pulmonary:     Effort: Pulmonary effort is normal.     Breath sounds: Normal breath sounds.     Comments: Lungs clear to auscultation bilaterally.  Symmetric chest rise.  No wheezing, rales, rhonchi.  No evidence of respiratory distress. Abdominal:     Palpations: Abdomen is soft. Abdomen is not rigid.     Tenderness: There is no abdominal tenderness. There is no guarding.  Musculoskeletal:        General: Normal range of motion.     Cervical back: Full passive range of motion  without pain.     Comments: Mild tenderness to the dorsal aspect of the right foot and ankle.  There is some overlying erythema, soft tissue swelling.  Compartments are soft.  She can wiggle all 5 toes.  Skin:    General: Skin is warm and dry.     Capillary Refill: Capillary refill takes less than 2 seconds.     Comments: Good distal cap refill.  RLE is not dusky in appearance or cool to touch.  Mild erythema, excoriations noted to the flexor surfaces of the bilateral upper extremities.  No hives noted. Small insect bite noted to the lateral aspect.   Neurological:     Mental Status: She is alert and oriented to person, place, and time.  Psychiatric:        Speech: Speech normal.     ED Results / Procedures / Treatments   Labs (all labs ordered are listed, but only abnormal results are displayed) Labs Reviewed - No data to display  EKG None  Radiology No results found.  Procedures Procedures (including critical care time)  Medications Ordered in ED Medications  sodium chloride 0.9 % bolus 1,000 mL (0 mLs Intravenous Stopped 08/01/20 1546)  diphenhydrAMINE (BENADRYL) injection 12.5 mg (12.5 mg Intravenous Given 08/01/20 1418)  famotidine (PEPCID) IVPB 20 mg premix (0 mg Intravenous Stopped 08/01/20 1420)  ibuprofen (ADVIL) tablet 600 mg (600 mg Oral Given 08/01/20 1733)    ED Course  I have reviewed the triage vital signs and the nursing notes.  Pertinent labs & imaging  results that were available during my care of the patient were reviewed by me and considered in my medical decision making (see chart for details).    MDM Rules/Calculators/A&P                          49 year old female with known history of allergy to yellowjacket/hornets who presents for evaluation of insect bite, allergic reaction.  States that it bit her on her right lower foot and started having pain, swelling.  Initially, felt a panicking and went to urgent care.  She was given an EpiPen and was told to go to the emergency department for further evaluation.  On initially arrival, she is afebrile, nontoxic-appearing.  Vital signs are within normal limits.  On exam, no difficulty breathing.  No evidence of oral angioedema noted concerning for anaphylaxis.  On exam, she does have some swelling erythema of her right foot.  Good pulses, compartments are soft.  Suspect this is localized reaction.  History/physical exam not concerning for infectious etiology, septic arthritis, compartment syndrome.  We will plan to give Benadryl, Pepcid.  Patient is allergic to prednisone so we will hold off on any steroids.  We will continue to monitor patient here in the ED.  Evaluation.  Patient resting comfortably.  She reports improvement in the rash on her arms.  She still having some swelling of her foot but does feel like it is getting slightly better.  Overlying warmth and erythema has since resolved.  Reevaluation.  Patient has been monitored in the ED for 3-1/2 hours.  She does feel like the swelling in her foot is getting slightly worse.  She has good distal pulses, good cap refill.  Compartments are soft.  I suspect this is most likely localized reaction.  Unfortunately, given her allergy to prednisone, cannot give her any prednisone for inflammation.  Reevaluation.  Patient has been observed for over 5 hours.  She does feel like the swelling is going down little bit.  She still has no other complaints and  denies any difficulty breathing, tongue or lip swelling.  She is hemodynamically stable.  Encourage Benadryl and Pepcid use for the next few days.  Will prescribe patient an EpiPen for her to use for any repeat allergic reaction. At this time, patient exhibits no emergent life-threatening condition that require further evaluation in ED. Patient had ample opportunity for questions and discussion. All patient's questions were answered with full understanding. Strict return precautions discussed. Patient expresses understanding and agreement to plan.   Portions of this note were generated with Scientist, clinical (histocompatibility and immunogenetics). Dictation errors may occur despite best attempts at proofreading.   Final Clinical Impression(s) / ED Diagnoses Final diagnoses:  Allergic reaction, initial encounter    Rx / DC Orders ED Discharge Orders         Ordered    famotidine (PEPCID) 20 MG tablet  2 times daily     Discontinue  Reprint     08/01/20 1811    EPINEPHrine 0.3 mg/0.3 mL IJ SOAJ injection  As needed     Discontinue  Reprint     08/01/20 1811           Maxwell Caul, PA-C 08/01/20 1853    Alvira Monday, MD 08/02/20 1517

## 2020-08-01 NOTE — ED Triage Notes (Signed)
Pt reports allergic reaction r/t to wasp sting to rigth foot ~9am-states she took benadryl 12.5mg  x 3 tabs-was alter seen at [facility]-states she received "0.3 epi shot" at 1215p-pt states allergic reaction is better-c/o pain to right foot and lower leg-NAD-steady gait

## 2020-08-01 NOTE — Discharge Instructions (Signed)
As we discussed, make sure you are elevating the leg and applying ice to help with pain and swelling.  Take Benadryl and Pepcid for the next few days.  You can take Tylenol or Ibuprofen as directed for pain. You can alternate Tylenol and Ibuprofen every 4 hours. If you take Tylenol at 1pm, then you can take Ibuprofen at 5pm. Then you can take Tylenol again at 9pm.   I provided you with an EpiPen that you can use if you were to have another allergic reaction refill like your airway was being compromised.  If you use the EpiPen, you need to be evaluated in the emergency department.  Follow-up with your primary care doctor.  Return the emergency department for any swelling of the foot, numbness/weakness of the foot, difficulty breathing, swelling of her tongue or lips or any other worsening or concerning symptoms.

## 2020-09-10 DIAGNOSIS — Z23 Encounter for immunization: Secondary | ICD-10-CM | POA: Diagnosis not present

## 2020-09-11 DIAGNOSIS — F429 Obsessive-compulsive disorder, unspecified: Secondary | ICD-10-CM | POA: Diagnosis not present

## 2020-09-11 DIAGNOSIS — F411 Generalized anxiety disorder: Secondary | ICD-10-CM | POA: Diagnosis not present

## 2020-09-30 DIAGNOSIS — N12 Tubulo-interstitial nephritis, not specified as acute or chronic: Secondary | ICD-10-CM | POA: Diagnosis not present

## 2020-09-30 DIAGNOSIS — R3989 Other symptoms and signs involving the genitourinary system: Secondary | ICD-10-CM | POA: Diagnosis not present

## 2020-10-13 DIAGNOSIS — N3010 Interstitial cystitis (chronic) without hematuria: Secondary | ICD-10-CM | POA: Diagnosis not present

## 2020-10-13 DIAGNOSIS — N3281 Overactive bladder: Secondary | ICD-10-CM | POA: Diagnosis not present

## 2020-10-13 DIAGNOSIS — R102 Pelvic and perineal pain: Secondary | ICD-10-CM | POA: Diagnosis not present

## 2020-10-21 DIAGNOSIS — Z87440 Personal history of urinary (tract) infections: Secondary | ICD-10-CM | POA: Diagnosis not present

## 2020-10-21 DIAGNOSIS — Z23 Encounter for immunization: Secondary | ICD-10-CM | POA: Diagnosis not present

## 2020-10-21 DIAGNOSIS — E039 Hypothyroidism, unspecified: Secondary | ICD-10-CM | POA: Diagnosis not present

## 2020-10-21 DIAGNOSIS — Z1159 Encounter for screening for other viral diseases: Secondary | ICD-10-CM | POA: Diagnosis not present

## 2020-10-21 DIAGNOSIS — Z13220 Encounter for screening for lipoid disorders: Secondary | ICD-10-CM | POA: Diagnosis not present

## 2020-10-21 DIAGNOSIS — Z0000 Encounter for general adult medical examination without abnormal findings: Secondary | ICD-10-CM | POA: Diagnosis not present

## 2021-01-20 DIAGNOSIS — N3010 Interstitial cystitis (chronic) without hematuria: Secondary | ICD-10-CM | POA: Diagnosis not present

## 2021-03-18 DIAGNOSIS — F429 Obsessive-compulsive disorder, unspecified: Secondary | ICD-10-CM | POA: Diagnosis not present

## 2021-03-18 DIAGNOSIS — F411 Generalized anxiety disorder: Secondary | ICD-10-CM | POA: Diagnosis not present

## 2021-05-28 DIAGNOSIS — Z6823 Body mass index (BMI) 23.0-23.9, adult: Secondary | ICD-10-CM | POA: Diagnosis not present

## 2021-05-28 DIAGNOSIS — Z01419 Encounter for gynecological examination (general) (routine) without abnormal findings: Secondary | ICD-10-CM | POA: Diagnosis not present

## 2021-05-28 DIAGNOSIS — Z1231 Encounter for screening mammogram for malignant neoplasm of breast: Secondary | ICD-10-CM | POA: Diagnosis not present

## 2021-06-01 ENCOUNTER — Other Ambulatory Visit: Payer: Self-pay | Admitting: Obstetrics and Gynecology

## 2021-06-01 DIAGNOSIS — R928 Other abnormal and inconclusive findings on diagnostic imaging of breast: Secondary | ICD-10-CM

## 2021-06-06 ENCOUNTER — Ambulatory Visit
Admission: RE | Admit: 2021-06-06 | Discharge: 2021-06-06 | Disposition: A | Discharge status: Home / Self Care | Payer: BC Managed Care – PPO | Source: Ambulatory Visit | Attending: Obstetrics and Gynecology | Admitting: Obstetrics and Gynecology

## 2021-06-06 DIAGNOSIS — R928 Other abnormal and inconclusive findings on diagnostic imaging of breast: Secondary | ICD-10-CM

## 2021-06-06 DIAGNOSIS — R922 Inconclusive mammogram: Secondary | ICD-10-CM | POA: Diagnosis not present

## 2021-06-06 DIAGNOSIS — N6001 Solitary cyst of right breast: Secondary | ICD-10-CM | POA: Diagnosis not present

## 2021-06-30 DIAGNOSIS — F429 Obsessive-compulsive disorder, unspecified: Secondary | ICD-10-CM | POA: Diagnosis not present

## 2021-06-30 DIAGNOSIS — F411 Generalized anxiety disorder: Secondary | ICD-10-CM | POA: Diagnosis not present

## 2021-09-15 DIAGNOSIS — F411 Generalized anxiety disorder: Secondary | ICD-10-CM | POA: Diagnosis not present

## 2021-09-15 DIAGNOSIS — F429 Obsessive-compulsive disorder, unspecified: Secondary | ICD-10-CM | POA: Diagnosis not present

## 2021-11-06 DIAGNOSIS — Z13220 Encounter for screening for lipoid disorders: Secondary | ICD-10-CM | POA: Diagnosis not present

## 2021-11-06 DIAGNOSIS — E039 Hypothyroidism, unspecified: Secondary | ICD-10-CM | POA: Diagnosis not present

## 2021-11-06 DIAGNOSIS — Z0000 Encounter for general adult medical examination without abnormal findings: Secondary | ICD-10-CM | POA: Diagnosis not present

## 2021-11-17 DIAGNOSIS — F429 Obsessive-compulsive disorder, unspecified: Secondary | ICD-10-CM | POA: Diagnosis not present

## 2021-11-17 DIAGNOSIS — F411 Generalized anxiety disorder: Secondary | ICD-10-CM | POA: Diagnosis not present

## 2021-11-19 DIAGNOSIS — K5904 Chronic idiopathic constipation: Secondary | ICD-10-CM | POA: Diagnosis not present

## 2021-12-22 DIAGNOSIS — R051 Acute cough: Secondary | ICD-10-CM | POA: Diagnosis not present

## 2021-12-22 DIAGNOSIS — U071 COVID-19: Secondary | ICD-10-CM | POA: Diagnosis not present

## 2022-02-23 DIAGNOSIS — F411 Generalized anxiety disorder: Secondary | ICD-10-CM | POA: Diagnosis not present

## 2022-02-23 DIAGNOSIS — F429 Obsessive-compulsive disorder, unspecified: Secondary | ICD-10-CM | POA: Diagnosis not present

## 2022-04-21 DIAGNOSIS — F411 Generalized anxiety disorder: Secondary | ICD-10-CM | POA: Diagnosis not present

## 2022-04-21 DIAGNOSIS — F429 Obsessive-compulsive disorder, unspecified: Secondary | ICD-10-CM | POA: Diagnosis not present

## 2022-06-09 DIAGNOSIS — R319 Hematuria, unspecified: Secondary | ICD-10-CM | POA: Diagnosis not present

## 2022-06-09 DIAGNOSIS — Z1231 Encounter for screening mammogram for malignant neoplasm of breast: Secondary | ICD-10-CM | POA: Diagnosis not present

## 2022-06-09 DIAGNOSIS — Z1272 Encounter for screening for malignant neoplasm of vagina: Secondary | ICD-10-CM | POA: Diagnosis not present

## 2022-06-09 DIAGNOSIS — Z6826 Body mass index (BMI) 26.0-26.9, adult: Secondary | ICD-10-CM | POA: Diagnosis not present

## 2022-06-09 DIAGNOSIS — Z1151 Encounter for screening for human papillomavirus (HPV): Secondary | ICD-10-CM | POA: Diagnosis not present

## 2022-06-09 DIAGNOSIS — Z01419 Encounter for gynecological examination (general) (routine) without abnormal findings: Secondary | ICD-10-CM | POA: Diagnosis not present

## 2022-06-14 ENCOUNTER — Other Ambulatory Visit: Payer: Self-pay | Admitting: Obstetrics and Gynecology

## 2022-06-14 DIAGNOSIS — R928 Other abnormal and inconclusive findings on diagnostic imaging of breast: Secondary | ICD-10-CM

## 2022-06-25 ENCOUNTER — Ambulatory Visit: Payer: BC Managed Care – PPO

## 2022-06-25 ENCOUNTER — Ambulatory Visit
Admission: RE | Admit: 2022-06-25 | Discharge: 2022-06-25 | Disposition: A | Discharge status: Home / Self Care | Payer: BC Managed Care – PPO | Source: Ambulatory Visit | Attending: Obstetrics and Gynecology | Admitting: Obstetrics and Gynecology

## 2022-06-25 DIAGNOSIS — R922 Inconclusive mammogram: Secondary | ICD-10-CM | POA: Diagnosis not present

## 2022-06-25 DIAGNOSIS — R928 Other abnormal and inconclusive findings on diagnostic imaging of breast: Secondary | ICD-10-CM

## 2022-08-25 DIAGNOSIS — F411 Generalized anxiety disorder: Secondary | ICD-10-CM | POA: Diagnosis not present

## 2022-08-25 DIAGNOSIS — F429 Obsessive-compulsive disorder, unspecified: Secondary | ICD-10-CM | POA: Diagnosis not present

## 2022-10-14 IMAGING — MG MM DIGITAL DIAGNOSTIC UNILAT*R* W/ TOMO W/ CAD
4 series · 4 of 12 positions shown · non-contrast
Comparison: Previous exams.

CLINICAL DATA: Screening recall for possible right breast mass.

EXAM:
DIGITAL DIAGNOSTIC UNILATERAL RIGHT MAMMOGRAM WITH TOMOSYNTHESIS AND
CAD; ULTRASOUND RIGHT BREAST LIMITED
TECHNIQUE: Right digital diagnostic mammography and breast tomosynthesis was
performed. The images were evaluated with computer-aided detection.;
Targeted ultrasound examination of the right breast was performed

[R MLO synth-2D]
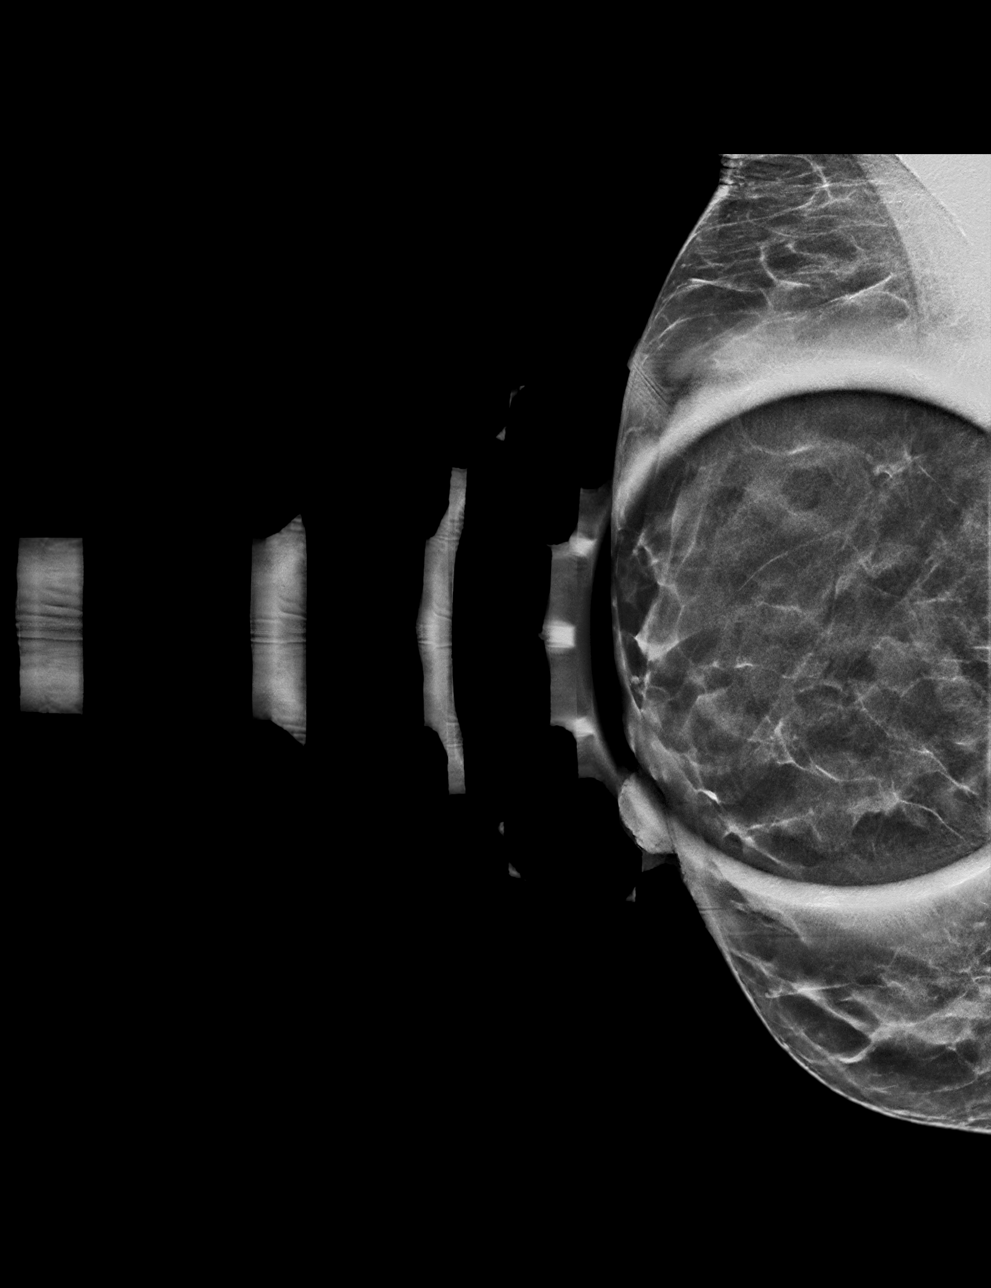

[R CC synth-2D]
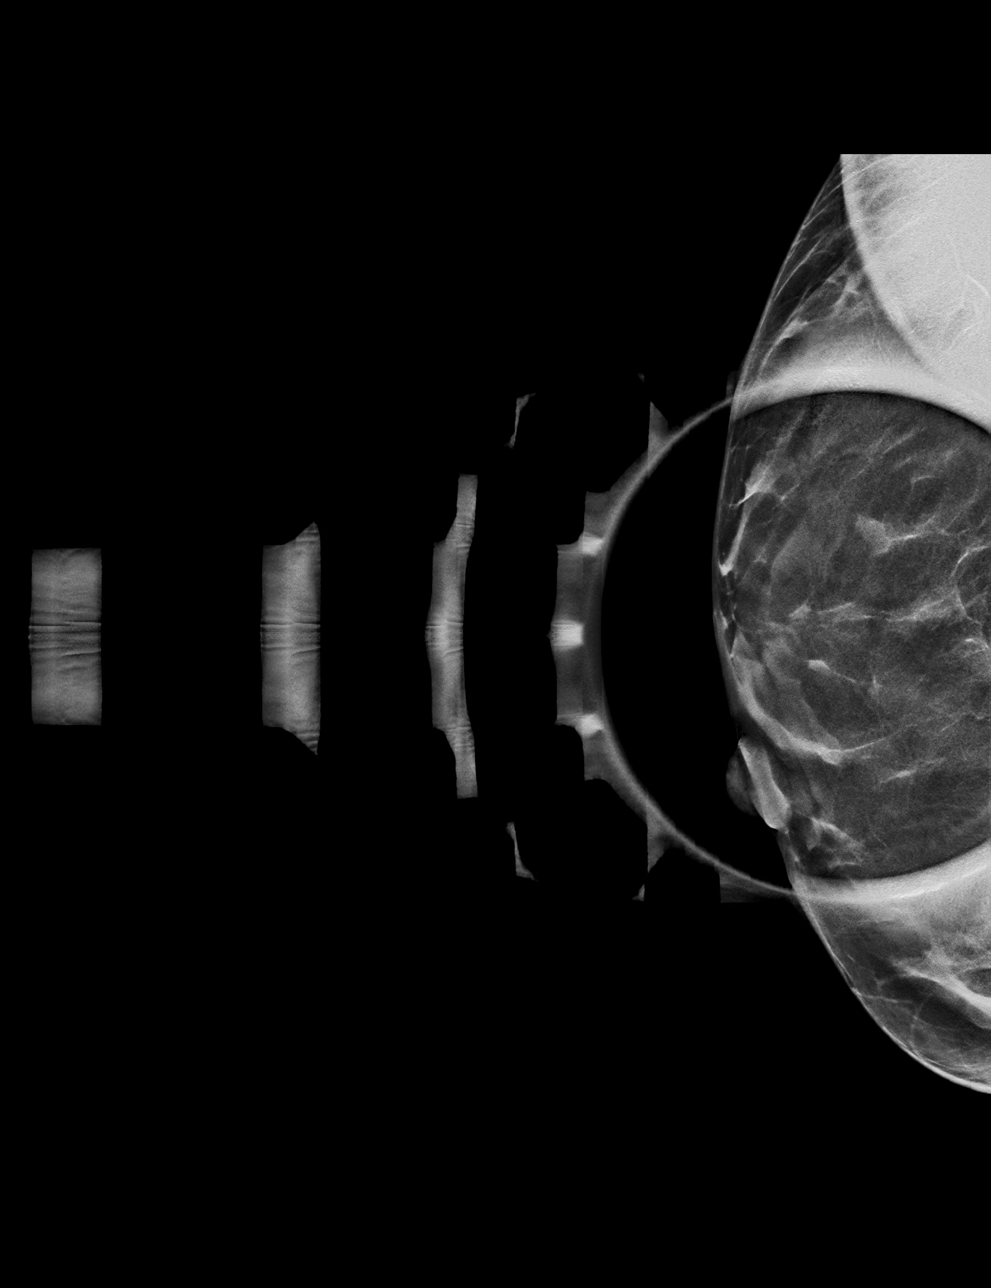

[R CC tomo · tomo slice 20/39.0]
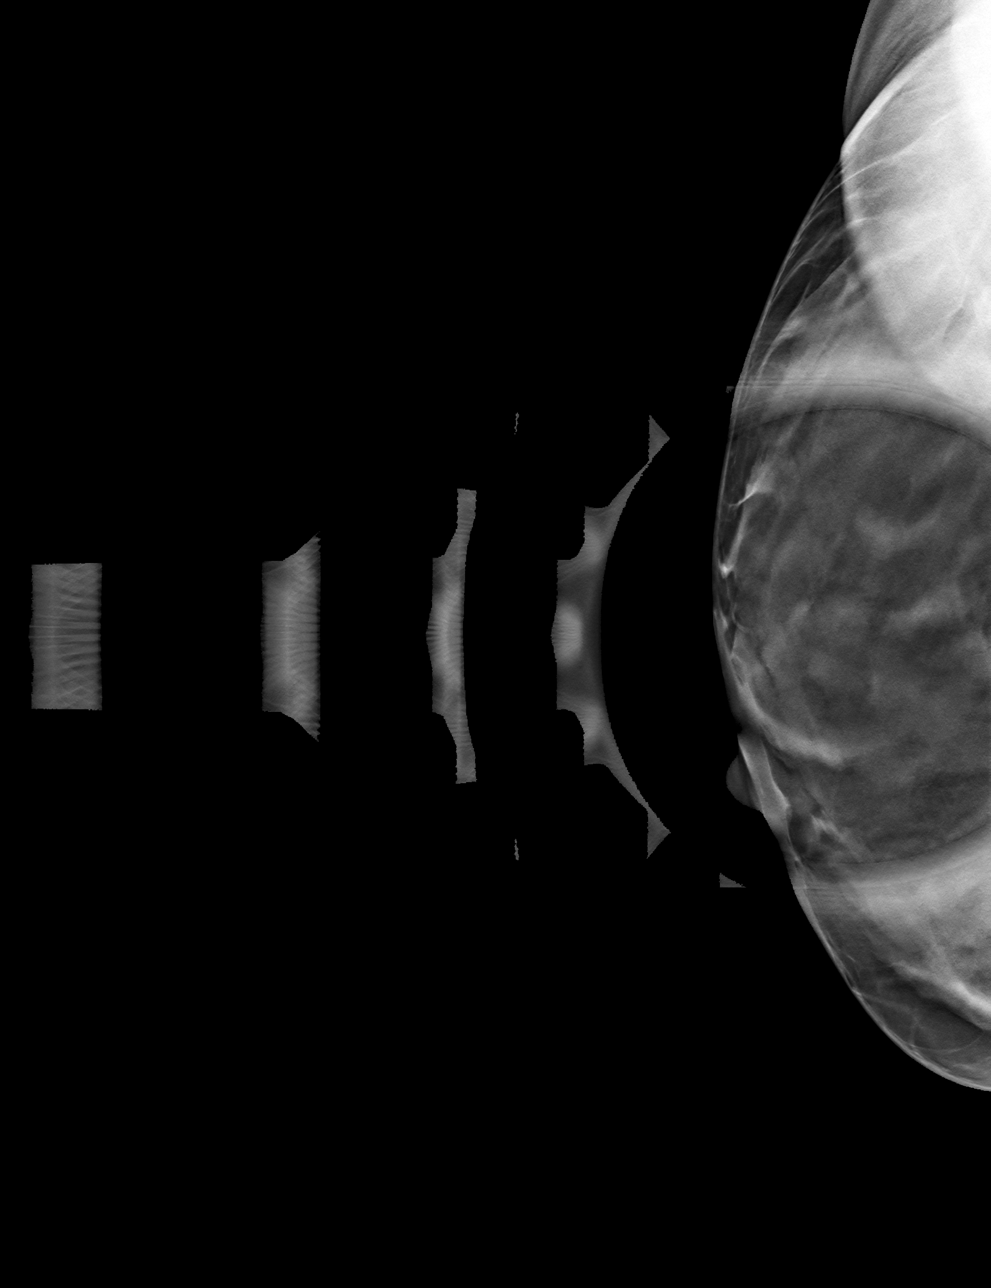

[R MLO tomo · tomo slice 25/48.0]
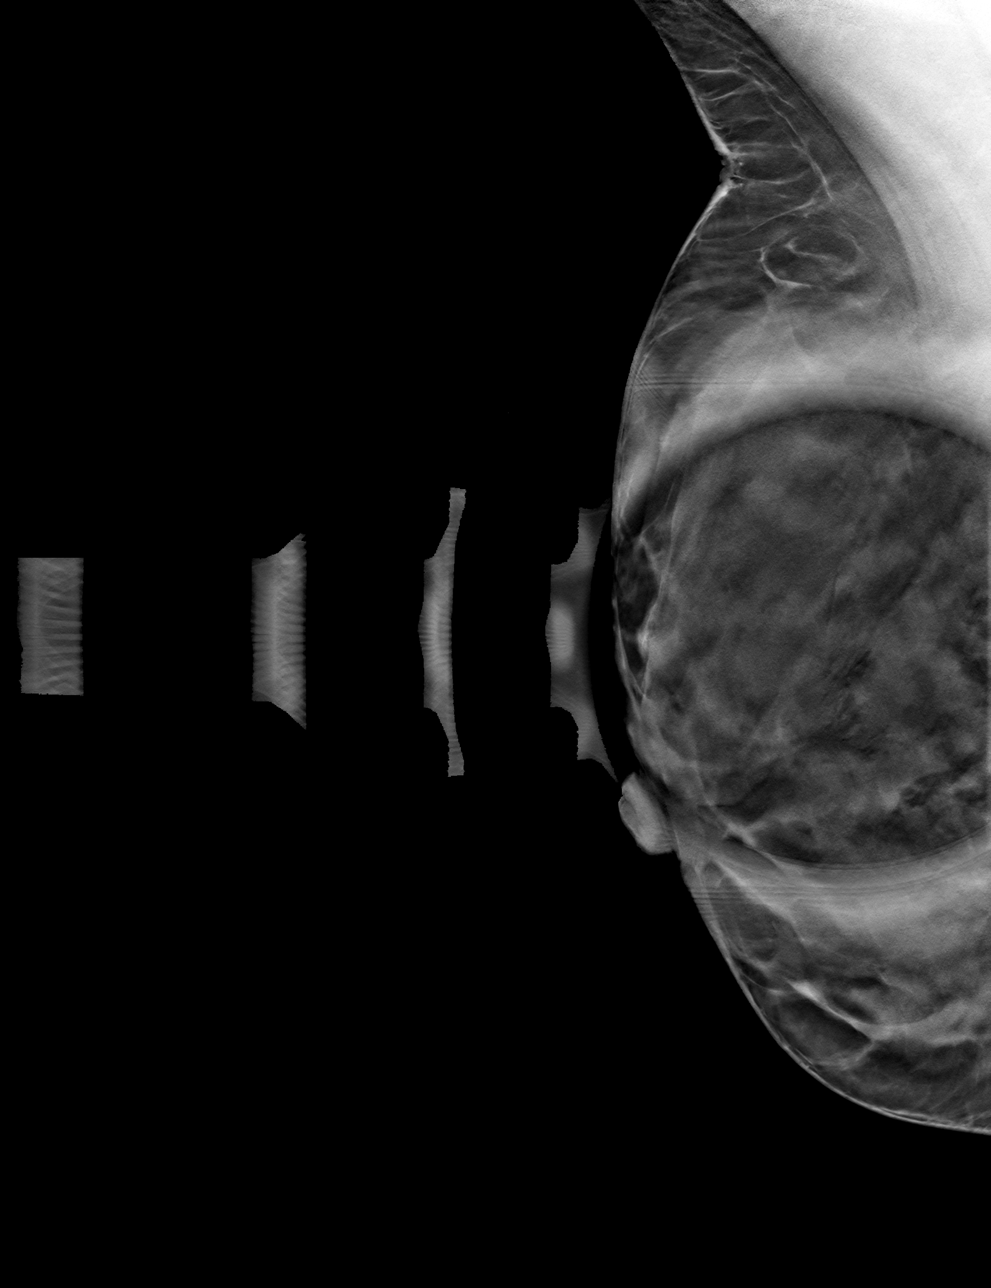

[4 of 12 positions shown; findings below may reference images not displayed]

ACR Breast Density Category c: The breast tissue is heterogeneously
dense, which may obscure small masses.
FINDINGS: Spot compression tomograms were performed over the upper central to
slightly outer right breast. The initially questioned oval mass in
the periareolar upper right breast is not clearly identified on the
additional spot compression tomograms.

Targeted ultrasound of the upper central and outer right breast was
performed demonstrating several areas of fibrocystic change and
extremely dense breast tissue. A cyst in the right breast at 12
o'clock 1 cm from nipple measures 0.8 x 0.4 x 0.9 cm. This is felt
to correspond well with the mass seen in the right breast on initial
screening mammography.
IMPRESSION: No findings of malignancy in the right breast.

RECOMMENDATION:
Screening mammogram in one year.(Code:[id number])

I have discussed the findings and recommendations with the patient.
If applicable, a reminder letter will be sent to the patient
regarding the next appointment.

BI-RADS CATEGORY  2: Benign.

## 2022-10-21 DIAGNOSIS — F429 Obsessive-compulsive disorder, unspecified: Secondary | ICD-10-CM | POA: Diagnosis not present

## 2022-10-21 DIAGNOSIS — F411 Generalized anxiety disorder: Secondary | ICD-10-CM | POA: Diagnosis not present

## 2022-11-03 DIAGNOSIS — M25552 Pain in left hip: Secondary | ICD-10-CM | POA: Diagnosis not present

## 2022-11-08 DIAGNOSIS — M25552 Pain in left hip: Secondary | ICD-10-CM | POA: Diagnosis not present

## 2022-11-17 DIAGNOSIS — M25552 Pain in left hip: Secondary | ICD-10-CM | POA: Diagnosis not present

## 2022-11-23 DIAGNOSIS — Z13220 Encounter for screening for lipoid disorders: Secondary | ICD-10-CM | POA: Diagnosis not present

## 2022-11-23 DIAGNOSIS — E039 Hypothyroidism, unspecified: Secondary | ICD-10-CM | POA: Diagnosis not present

## 2022-11-23 DIAGNOSIS — Z0000 Encounter for general adult medical examination without abnormal findings: Secondary | ICD-10-CM | POA: Diagnosis not present

## 2022-11-23 DIAGNOSIS — H9193 Unspecified hearing loss, bilateral: Secondary | ICD-10-CM | POA: Diagnosis not present

## 2022-11-23 DIAGNOSIS — Z23 Encounter for immunization: Secondary | ICD-10-CM | POA: Diagnosis not present

## 2022-12-01 DIAGNOSIS — M25552 Pain in left hip: Secondary | ICD-10-CM | POA: Diagnosis not present

## 2023-01-07 DIAGNOSIS — M25552 Pain in left hip: Secondary | ICD-10-CM | POA: Diagnosis not present

## 2023-01-13 DIAGNOSIS — F429 Obsessive-compulsive disorder, unspecified: Secondary | ICD-10-CM | POA: Diagnosis not present

## 2023-01-13 DIAGNOSIS — F411 Generalized anxiety disorder: Secondary | ICD-10-CM | POA: Diagnosis not present

## 2023-01-14 DIAGNOSIS — M25552 Pain in left hip: Secondary | ICD-10-CM | POA: Diagnosis not present

## 2023-01-21 DIAGNOSIS — H9313 Tinnitus, bilateral: Secondary | ICD-10-CM | POA: Diagnosis not present

## 2023-02-10 DIAGNOSIS — M25512 Pain in left shoulder: Secondary | ICD-10-CM | POA: Diagnosis not present

## 2023-02-10 DIAGNOSIS — M5030 Other cervical disc degeneration, unspecified cervical region: Secondary | ICD-10-CM | POA: Diagnosis not present

## 2023-03-10 DIAGNOSIS — F411 Generalized anxiety disorder: Secondary | ICD-10-CM | POA: Diagnosis not present

## 2023-03-10 DIAGNOSIS — F429 Obsessive-compulsive disorder, unspecified: Secondary | ICD-10-CM | POA: Diagnosis not present

## 2023-03-12 DIAGNOSIS — M25512 Pain in left shoulder: Secondary | ICD-10-CM | POA: Diagnosis not present

## 2023-04-05 DIAGNOSIS — M25512 Pain in left shoulder: Secondary | ICD-10-CM | POA: Diagnosis not present

## 2023-05-05 DIAGNOSIS — F429 Obsessive-compulsive disorder, unspecified: Secondary | ICD-10-CM | POA: Diagnosis not present

## 2023-05-05 DIAGNOSIS — F411 Generalized anxiety disorder: Secondary | ICD-10-CM | POA: Diagnosis not present

## 2023-06-14 DIAGNOSIS — N898 Other specified noninflammatory disorders of vagina: Secondary | ICD-10-CM | POA: Diagnosis not present

## 2023-06-14 DIAGNOSIS — Z6826 Body mass index (BMI) 26.0-26.9, adult: Secondary | ICD-10-CM | POA: Diagnosis not present

## 2023-06-14 DIAGNOSIS — R8290 Unspecified abnormal findings in urine: Secondary | ICD-10-CM | POA: Diagnosis not present

## 2023-06-14 DIAGNOSIS — Z01419 Encounter for gynecological examination (general) (routine) without abnormal findings: Secondary | ICD-10-CM | POA: Diagnosis not present

## 2023-06-14 DIAGNOSIS — Z1231 Encounter for screening mammogram for malignant neoplasm of breast: Secondary | ICD-10-CM | POA: Diagnosis not present

## 2023-06-14 DIAGNOSIS — R61 Generalized hyperhidrosis: Secondary | ICD-10-CM | POA: Diagnosis not present

## 2023-06-14 DIAGNOSIS — R635 Abnormal weight gain: Secondary | ICD-10-CM | POA: Diagnosis not present

## 2023-06-14 DIAGNOSIS — N390 Urinary tract infection, site not specified: Secondary | ICD-10-CM | POA: Diagnosis not present

## 2023-08-16 DIAGNOSIS — F429 Obsessive-compulsive disorder, unspecified: Secondary | ICD-10-CM | POA: Diagnosis not present

## 2023-08-16 DIAGNOSIS — F411 Generalized anxiety disorder: Secondary | ICD-10-CM | POA: Diagnosis not present

## 2023-08-17 DIAGNOSIS — N951 Menopausal and female climacteric states: Secondary | ICD-10-CM | POA: Diagnosis not present

## 2023-09-29 DIAGNOSIS — D2272 Melanocytic nevi of left lower limb, including hip: Secondary | ICD-10-CM | POA: Diagnosis not present

## 2023-09-29 DIAGNOSIS — D2239 Melanocytic nevi of other parts of face: Secondary | ICD-10-CM | POA: Diagnosis not present

## 2023-09-29 DIAGNOSIS — L710 Perioral dermatitis: Secondary | ICD-10-CM | POA: Diagnosis not present

## 2023-09-29 DIAGNOSIS — D225 Melanocytic nevi of trunk: Secondary | ICD-10-CM | POA: Diagnosis not present

## 2023-12-06 DIAGNOSIS — R8290 Unspecified abnormal findings in urine: Secondary | ICD-10-CM | POA: Diagnosis not present

## 2023-12-06 DIAGNOSIS — N390 Urinary tract infection, site not specified: Secondary | ICD-10-CM | POA: Diagnosis not present

## 2024-02-23 NOTE — Progress Notes (Signed)
 Cardiology Office Note:   Date:  02/24/2024  ID:  Theresa Cuevas, DOB [birth year], MRN [id number] PCP:  Theresa Beams, MD  [facility] Providers Cardiologist:  Alverda Skeans, MD Referring MD: Theresa Beams, MD  Chief Complaint/Reason for Referral: Dyspnea ASSESSMENT:    1. Dyspnea on exertion   2. Palpitations   3. BMI 25.0-25.9,adult   4. Daytime somnolence     PLAN:   In order of problems listed above: Dyspnea: BNP recently was normal.  We will obtain a coronary CTA and echocardiogram to evaluate further.  If the patient has mild obstructive coronary artery disease, they will require a statin (with goal LDL < 70) and aspirin, if they have high-grade disease we will need to consider optimal medical therapy and if symptoms are refractory to medical therapy, then a cardiac catheterization with possible PCI will be pursued to alleviate symptoms.  If they have high risk disease we will proceed directly to cardiac catheterization.   Palpitations: We will obtain a monitor and echocardiogram.  TSH recently was normal. Elevated BMI: Diet and exercise modification. Daytime somnolence: Will obtain sleep apnea evaluation.            Dispo:  Return in about 3 months (around 05/26/2024) for Routine follow up in 3 months with Dr. Lynnette Cuevas.      Medication Adjustments/Labs and Tests Ordered: Current medicines are reviewed at length with the patient today.  Concerns regarding medicines are outlined above.  The following changes have been made:  no change   Labs/tests ordered: Orders Placed This Encounter  Procedures   CT CORONARY MORPH W/CTA COR W/SCORE W/CA W/CM &/OR WO/CM   LONG TERM MONITOR (3-14 DAYS)   EKG 12-Lead   ECHOCARDIOGRAM COMPLETE   [facility] Sleep Study    Medication Changes: Meds ordered this encounter  Medications   metoprolol tartrate (LOPRESSOR) 50 MG tablet    Sig: Take one (1) tablet by mouth ( 50 mg) 2 hours prior to CT scan.    Dispense:  1 tablet     Refill:  0    Current medicines are reviewed at length with the patient today.  The patient does not have concerns regarding medicines.    History of Present Illness:      FOCUSED PROBLEM LIST:   Hypothyroidism BMI 13 March 2024: Patient consents to use of AI scribe. The patient is a 53 year old female with the above listed medical problems referred for recommendations regarding dyspnea and tachycardia/palpitations.  The patient was seen by her primary care provider.  She noted that she was short of breath over the summer and believe this was due to the hot weather.  She also noted elevated heart rates at times.  An EKG was not performed at that visit.  She was referred to cardiology for further recommendations.  Since the summer, she has experienced significant shortness of breath and palpitations during physical exertion, such as horseback riding and workouts. These symptoms have persisted into the fall and winter, occurring every time she rides her horse or works out, with her heart rate increasing to 169-172 bpm. She experiences dizziness, dry heaving, and a sensation of her heart 'coming out of my chest'. Her heart rate remains elevated even after stopping the activity, and she feels shaky, needing to take breaks during activities that previously did not require them. She experiences these symptoms at least four times a week, correlating with her riding schedule.  She reports a sensation of heartburn and pain on the  left side near her bra line, particularly when lying on her left side. No chest pressure, but she describes a heavy feeling in the chest.  She experiences frequent nighttime awakenings to urinate and feels exhausted despite sleeping for eight hours. She often needs to nap during the day and experiences headaches after physical exertion. No episodes of passing out, swelling in her legs, or significant weight changes.  She is a stay-at-home mom who does not smoke, drink alcohol, or use  recreational drugs. She feels frustrated by her inability to perform activities she used to enjoy without symptoms.          Current Medications: Current Meds  Medication Sig   amphetamine-dextroamphetamine (ADDERALL) 20 MG tablet dextroamphetamine-amphetamine 20 mg tablet TAKE 1 TABLET BY MOUTH TWICE DAILY   APLENZIN 522 MG TB24 Aplenzin 522 mg tablet,extended release   EPINEPHrine 0.3 mg/0.3 mL IJ SOAJ injection Inject 0.3 mLs (0.3 mg total) into the muscle as needed for anaphylaxis.   fluvoxaMINE (LUVOX) 100 MG tablet Take 100 mg by mouth at bedtime.   metoprolol tartrate (LOPRESSOR) 50 MG tablet Take one (1) tablet by mouth ( 50 mg) 2 hours prior to CT scan.   NP THYROID 60 MG tablet NP Thyroid 60 mg tablet   progesterone (PROMETRIUM) 200 MG capsule Take 600 mg by mouth daily.   sennosides-docusate sodium (SENOKOT-S) 8.6-50 MG tablet Take 2 tablets by mouth daily.   spironolactone (ALDACTONE) 50 MG tablet Take 1 tablet twice a day by oral route for 90 days.     Review of Systems:   Please see the history of present illness.    All other systems reviewed and are negative.     EKGs/Labs/Other Test Reviewed:   EKG: EKG today demonstrates sinus rhythm  EKG Interpretation Date/Time:  Friday February 24 2024 08:36:09 EST Ventricular Rate:  75 PR Interval:  134 QRS Duration:  78 QT Interval:  362 QTC Calculation: 404 R Axis:   62  Text Interpretation: Normal sinus rhythm Normal ECG No previous ECGs available Confirmed by Alverda Skeans ([id number]) on 02/24/2024 8:39:34 AM         Risk Assessment/Calculations:     STOP-Bang Score:  2       Physical Exam:   VS:  BP 122/81   Pulse 71   Ht 5\' 8"  (1.727 m)   Wt 157 lb (71.2 kg)   LMP 11/18/2011   SpO2 99%   BMI 23.87 kg/m        Wt Readings from Last 3 Encounters:  02/24/24 157 lb (71.2 kg)  05/25/16 188 lb (85.3 kg)  11/23/11 183 lb (83 kg)      GENERAL:  No apparent distress, AOx3 HEENT:  No carotid bruits, +2 carotid  impulses, no scleral icterus CAR: RRR  no murmurs, gallops, rubs, or thrills RES:  Clear to auscultation bilaterally ABD:  Soft, nontender, nondistended, positive bowel sounds x 4 VASC:  +2 radial pulses, +2 carotid pulses NEURO:  CN 2-12 grossly intact; motor and sensory grossly intact PSYCH:  No active depression or anxiety EXT:  No edema, ecchymosis, or cyanosis  Signed, Orbie Pyo, MD  02/24/2024 9:17 AM    [facility] [address] [place], [place], [place]  [address] Phone: [phone]; Fax: [phone]   Note:  This document was prepared using Dragon voice recognition software and may include unintentional dictation errors.

## 2024-02-24 ENCOUNTER — Ambulatory Visit: Attending: Internal Medicine | Admitting: Internal Medicine

## 2024-02-24 ENCOUNTER — Encounter: Payer: Self-pay | Admitting: Internal Medicine

## 2024-02-24 ENCOUNTER — Telehealth: Payer: Self-pay | Admitting: *Deleted

## 2024-02-24 ENCOUNTER — Ambulatory Visit (INDEPENDENT_AMBULATORY_CARE_PROVIDER_SITE_OTHER)

## 2024-02-24 VITALS — BP 122/81 | HR 71 | Ht 68.0 in | Wt 157.0 lb

## 2024-02-24 DIAGNOSIS — R400 Somnolence: Secondary | ICD-10-CM

## 2024-02-24 DIAGNOSIS — R002 Palpitations: Secondary | ICD-10-CM | POA: Diagnosis not present

## 2024-02-24 DIAGNOSIS — Z6825 Body mass index (BMI) 25.0-25.9, adult: Secondary | ICD-10-CM

## 2024-02-24 DIAGNOSIS — R0609 Other forms of dyspnea: Secondary | ICD-10-CM

## 2024-02-24 MED ORDER — METOPROLOL TARTRATE 50 MG PO TABS: ORAL_TABLET | ORAL | 0 refills | Status: DC

## 2024-02-24 NOTE — Telephone Encounter (Signed)
 DR. Lynnette Caffey ORDERED Theresa Cuevas SLEEP STUDY.   Patient agreement reviewed and signed on 02/24/2024.  WatchPAT issued to patient on 02/24/2024 by Danielle Rankin, CMA. Patient aware to not open the WatchPAT box until contacted with the activation PIN. Patient profile initialized in CloudPAT on 02/24/2024 by Danielle Rankin, CMA. Device serial number: [id number]  Please list Reason for Call as Advice Only and type "WatchPAT issued to patient" in the comment box.

## 2024-02-24 NOTE — Progress Notes (Unsigned)
 Enrolled patient for a 7 day Zio XT monitor to be mailed to patients home.

## 2024-02-24 NOTE — Patient Instructions (Signed)
 Medication Instructions:   Your physician recommends that you continue on your current medications as directed. Please refer to the Current Medication list given to you today.   *If you need a refill on your cardiac medications before your next appointment, please call your pharmacy*   Lab Work:  None ordered.  If you have labs (blood work) drawn today and your tests are completely normal, you will receive your results only by: MyChart Message (if you have MyChart) OR A paper copy in the mail If you have any lab test that is abnormal or we need to change your treatment, we will call you to review the results.   Testing/Procedures:    Your cardiac CT will be scheduled at one of the below locations:   [facility] [address] [place], [place] [address] [phone]  OR   If scheduled at [facility], please arrive at the [facility] and Children's Entrance (Entrance C2) of [facility] 30 minutes prior to test start time. You can use the FREE valet parking offered at entrance C (encouraged to control the heart rate for the test)  Proceed to the [facility] Radiology Department (first floor) to check-in and test prep.  All radiology patients and guests should use entrance C2 at [facility], accessed from [facility], even though the hospital's physical address listed is [address].    Please follow these instructions carefully (unless otherwise directed):  An IV will be required for this test and Nitroglycerin will be given.    On the Night Before the Test: Be sure to Drink plenty of water. Do not consume any caffeinated/decaffeinated beverages or chocolate 12 hours prior to your test. Do not take any antihistamines 12 hours prior to your test.  On the Day of the Test: Drink plenty of water until 1 hour prior to the test. Do not eat any food 1 hour prior to test. You may take your regular medications prior to  the test.  Take metoprolol (Lopressor) one tablet by mouth (50 mg) two hours prior to test. FEMALES- please wear underwire-free bra if available, avoid dresses & tight clothing      After the Test: Drink plenty of water. After receiving IV contrast, you may experience a mild flushed feeling. This is normal. On occasion, you may experience a mild rash up to 24 hours after the test. This is not dangerous. If this occurs, you can take Benadryl 25 mg, Zyrtec, Claritin, or Allegra and increase your fluid intake. (Patients taking Tikosyn should avoid Benadryl, and may take Zyrtec, Claritin, or Allegra) If you experience trouble breathing, this can be serious. If it is severe call 911 IMMEDIATELY. If it is mild, please call our office.  We will call to schedule your test 2-4 weeks out understanding that some insurance companies will need an authorization prior to the service being performed.   For more information and frequently asked questions, please visit our website : [email or web address]  For non-scheduling related questions, please contact the cardiac imaging nurse navigator should you have any questions/concerns: Cardiac Imaging Nurse Navigators Direct Office Dial: [phone]    For scheduling needs, including cancellations and rescheduling, please call [place], [phone].   Your physician has requested that you have an echocardiogram. Echocardiography is a painless test that uses sound waves to create images of your heart. It provides your doctor with information about the size and shape of your heart and how well your heart's chambers and  valves are working. This procedure takes approximately one hour. There are no restrictions for this procedure. Please do NOT wear cologne, perfume, or lotions (deodorant is allowed). Please arrive 15 minutes prior to your appointment time.  ZIO XT- Long Term Monitor Instructions  Your physician has requested you wear a ZIO patch  monitor for 14 days.  This is a single patch monitor. [employer] supplies one patch monitor per enrollment. Additional stickers are not available. Please do not apply patch if you will be having a Nuclear Stress Test,  Echocardiogram, Cardiac CT, MRI, or Chest Xray during the period you would be wearing the  monitor. The patch cannot be worn during these tests. You cannot remove and re-apply the  ZIO XT patch monitor.  Your ZIO patch monitor will be mailed 3 day USPS to your address on file. It may take 3-5 days  to receive your monitor after you have been enrolled.  Once you have received your monitor, please review the enclosed instructions. Your monitor  has already been registered assigning a specific monitor serial # to you.  Billing and Patient Assistance Program Information  We have supplied [employer] with any of your insurance information on file for billing purposes. [employer] offers a sliding scale Patient Assistance Program for patients that do not have  insurance, or whose insurance does not completely cover the cost of the ZIO monitor.  You must apply for the Patient Assistance Program to qualify for this discounted rate.  To apply, please call [employer] at [phone], select option 4, select option 2, ask to apply for  Patient Assistance Program. Meredeth Ide will ask your household income, and how many people  are in your household. They will quote your out-of-pocket cost based on that information.  [employer] will also be able to set up a 56-month, interest-free payment plan if needed.  Applying the monitor   Shave hair from upper left chest.  Hold abrader disc by orange tab. Rub abrader in 40 strokes over the upper left chest as  indicated in your monitor instructions.  Clean area with 4 enclosed alcohol pads. Let dry.  Apply patch as indicated in monitor instructions. Patch will be placed under collarbone on left  side of chest with arrow pointing upward.  Rub patch adhesive wings  for 2 minutes. Remove white label marked "1". Remove the white  label marked "2". Rub patch adhesive wings for 2 additional minutes.  While looking in a mirror, press and release button in center of patch. A small green light will  flash 3-4 times. This will be your only indicator that the monitor has been turned on.  Do not shower for the first 24 hours. You may shower after the first 24 hours.  Press the button if you feel a symptom. You will hear a small click. Record Date, Time and  Symptom in the Patient Logbook.  When you are ready to remove the patch, follow instructions on the last 2 pages of Patient  Logbook. Stick patch monitor onto the last page of Patient Logbook.  Place Patient Logbook in the blue and white box. Use locking tab on box and tape box closed  securely. The blue and white box has prepaid postage on it. Please place it in the mailbox as  soon as possible. Your physician should have your test results approximately 7 days after the  monitor has been mailed back to [facility].  Call [facility] Customer Care at [phone] if you have questions regarding  your  ZIO XT patch monitor. Call them immediately if you see an orange light blinking on your  monitor.  If your monitor falls off in less than 4 days, contact our Monitor department at [phone].  If your monitor becomes loose or falls off after 4 days call [employer] at [phone] for  suggestions on securing your monitor  Your physician has recommended that you have a sleep study. This test records several body functions during sleep, including: brain activity, eye movement, oxygen and carbon dioxide blood levels, heart rate and rhythm, breathing rate and rhythm, the flow of air through your mouth and nose, snoring, body muscle movements, and chest and belly movement.   Follow-Up: At [facility], you and your health needs are our priority.  As part of our continuing mission to provide you with  exceptional heart care, we have created designated Provider Care Teams.  These Care Teams include your primary Cardiologist (physician) and Advanced Practice Providers (APPs -  Physician Assistants and Nurse Practitioners) who all work together to provide you with the care you need, when you need it.  We recommend signing up for the patient portal called "MyChart".  Sign up information is provided on this After Visit Summary.  MyChart is used to connect with patients for Virtual Visits (Telemedicine).  Patients are able to view lab/test results, encounter notes, upcoming appointments, etc.  Non-urgent messages can be sent to your provider as well.   To learn more about what you can do with MyChart, go to [email or web address].    Your next appointment:   3 month(s)  Provider:   Dr. Lynnette Caffey     Other Instructions    1st Floor: - Lobby - Registration  - Pharmacy  - Lab - Cafe  2nd Floor: - PV Lab - Diagnostic Testing (echo, CT, nuclear med)  3rd Floor: - Vacant  4th Floor: - TCTS (cardiothoracic surgery) - AFib Clinic - Structural Heart Clinic - Vascular Surgery  - Vascular Ultrasound  5th Floor: - HeartCare Cardiology (general and EP) - Clinical Pharmacy for coumadin, hypertension, lipid, weight-loss medications, and med management appointments    Valet parking services will be available as well.

## 2024-02-25 ENCOUNTER — Other Ambulatory Visit: Payer: Self-pay | Admitting: Internal Medicine

## 2024-02-29 DIAGNOSIS — R002 Palpitations: Secondary | ICD-10-CM

## 2024-02-29 DIAGNOSIS — R0609 Other forms of dyspnea: Secondary | ICD-10-CM

## 2024-03-14 ENCOUNTER — Encounter: Payer: Self-pay | Admitting: Internal Medicine

## 2024-03-15 ENCOUNTER — Encounter (INDEPENDENT_AMBULATORY_CARE_PROVIDER_SITE_OTHER): Payer: Self-pay | Admitting: Cardiology

## 2024-03-15 DIAGNOSIS — G4733 Obstructive sleep apnea (adult) (pediatric): Secondary | ICD-10-CM

## 2024-03-15 NOTE — Telephone Encounter (Signed)
 Ordering provider: DR Lynnette Caffey Associated diagnoses: R40.0 WatchPAT PA obtained on 03/15/2024 by Latrelle Dodrill, CMA. Authorization: Authorized-[id number]-Approval Valid Through:03/15/2024 - 05/13/2024  Patient notified of PIN (1234) on 03/15/2024 via Notification Method: phone.

## 2024-03-16 ENCOUNTER — Encounter: Payer: Self-pay | Admitting: Internal Medicine

## 2024-03-16 ENCOUNTER — Ambulatory Visit (HOSPITAL_COMMUNITY): Attending: Internal Medicine

## 2024-03-16 DIAGNOSIS — Z6825 Body mass index (BMI) 25.0-25.9, adult: Secondary | ICD-10-CM | POA: Diagnosis present

## 2024-03-16 DIAGNOSIS — R002 Palpitations: Secondary | ICD-10-CM | POA: Diagnosis present

## 2024-03-16 DIAGNOSIS — R400 Somnolence: Secondary | ICD-10-CM | POA: Insufficient documentation

## 2024-03-16 DIAGNOSIS — R0609 Other forms of dyspnea: Secondary | ICD-10-CM | POA: Diagnosis present

## 2024-03-16 LAB — ECHOCARDIOGRAM COMPLETE
Area-P 1/2: 2.66 cm2
S' Lateral: 3.4 cm

## 2024-03-19 ENCOUNTER — Encounter: Payer: Self-pay | Admitting: Internal Medicine

## 2024-03-20 ENCOUNTER — Other Ambulatory Visit (HOSPITAL_COMMUNITY): Payer: Self-pay | Admitting: Gastroenterology

## 2024-03-20 ENCOUNTER — Ambulatory Visit (HOSPITAL_COMMUNITY)
Admission: RE | Admit: 2024-03-20 | Discharge: 2024-03-20 | Disposition: A | Discharge status: Home / Self Care | Source: Ambulatory Visit | Attending: Gastroenterology | Admitting: Gastroenterology

## 2024-03-20 ENCOUNTER — Ambulatory Visit: Attending: Internal Medicine

## 2024-03-20 DIAGNOSIS — R400 Somnolence: Secondary | ICD-10-CM

## 2024-03-20 DIAGNOSIS — R197 Diarrhea, unspecified: Secondary | ICD-10-CM | POA: Insufficient documentation

## 2024-03-20 DIAGNOSIS — Z6825 Body mass index (BMI) 25.0-25.9, adult: Secondary | ICD-10-CM

## 2024-03-20 DIAGNOSIS — R002 Palpitations: Secondary | ICD-10-CM

## 2024-03-20 DIAGNOSIS — R109 Unspecified abdominal pain: Secondary | ICD-10-CM | POA: Insufficient documentation

## 2024-03-20 DIAGNOSIS — R0609 Other forms of dyspnea: Secondary | ICD-10-CM

## 2024-03-20 MED ORDER — IOHEXOL 9 MG/ML PO SOLN
ORAL | Status: AC
  Filled 2024-03-20: qty 1000

## 2024-03-20 MED ORDER — IOHEXOL 9 MG/ML PO SOLN
1000.0000 mL | ORAL | Status: AC
  Administered 2024-03-20: 1000 mL via ORAL

## 2024-03-20 MED ORDER — IOHEXOL 300 MG/ML  SOLN
100.0000 mL | Freq: Once | INTRAMUSCULAR | Status: AC | PRN
  Administered 2024-03-20: 100 mL via INTRAVENOUS

## 2024-03-20 MED ORDER — SODIUM CHLORIDE (PF) 0.9 % IJ SOLN
INTRAMUSCULAR | Status: AC
  Filled 2024-03-20: qty 50

## 2024-03-20 NOTE — Procedures (Signed)
   SLEEP STUDY REPORT Patient Information Study Date: 03/15/2024 Patient Name: Theresa Cuevas Patient ID: [id number] Birth Date: 1971/07/07 Age: 53 Gender: Female BMI: 23.7 (W=156 lb, H=5' 8'') Stopbang: 2 Referring Physician: Alverda Skeans, MD  TEST DESCRIPTION: Home sleep apnea testing was completed using the WatchPat, a Type 1 device, utilizing peripheral arterial tonometry (PAT), chest movement, actigraphy, pulse oximetry, pulse rate, body position and snore. AHI was calculated with apnea and hypopnea using valid sleep time as the denominator. RDI includes apneas, hypopneas, and RERAs. The data acquired and the scoring of sleep and all associated events were performed in accordance with the recommended standards and specifications as outlined in the AASM Manual for the Scoring of Sleep and Associated Events 2.2.0 (2015).  FINDINGS:  1. Mild Obstructive Sleep Apnea with AHI 7.2/hr.  2. No Central Sleep Apnea with pAHIc 0.9/hr.  3. Oxygen desaturations as low as 86%.  4. Moderate to severe snoring was present. O2 sats were < 88% for 0 min.  5. Total sleep time was 6 hrs and 8 min.  6. 16.1% of total sleep time was spent in REM sleep.  7. Normal sleep onset latency at 22 min.  8. Shortened REM sleep onset latency at 47 min.  9. Total awakenings were 8. 10. Arrhythmia detection: None  DIAGNOSIS: Mild Obstructive Sleep Apnea (G47.33)  RECOMMENDATIONS: 1. Clinical correlation of these findings is necessary. The decision to treat obstructive sleep apnea (OSA) is usually based on the presence of apnea symptoms or the presence of associated medical conditions such as Hypertension, Congestive Heart Failure, Atrial Fibrillation or Obesity. The most common symptoms of OSA are snoring, gasping for breath while sleeping, daytime sleepiness and fatigue.  2. Initiating apnea therapy is recommended given the presence of symptoms and/or associated conditions. Recommend proceeding with  one of the following:   a. Auto-CPAP therapy with a pressure range of 5-20cm H2O.   b. An oral appliance (OA) that can be obtained from certain dentists with expertise in sleep medicine. These are primarily of use in non-obese patients with mild and moderate disease.   c. An ENT consultation which may be useful to look for specific causes of obstruction and possible treatment options.   d. If patient is intolerant to PAP therapy, consider referral to ENT for evaluation for hypoglossal nerve stimulator.  3. Close follow-up is necessary to ensure success with CPAP or oral appliance therapy for maximum benefit .  4. A follow-up oximetry study on CPAP is recommended to assess the adequacy of therapy and determine the need for supplemental oxygen or the potential need for Bi-level therapy. An arterial blood gas to determine the adequacy of baseline ventilation and oxygenation should also be considered.  5. Healthy sleep recommendations include: adequate nightly sleep (normal 7-9 hrs/night), avoidance of caffeine after noon and alcohol near bedtime, and maintaining a sleep environment that is cool, dark and quiet.  6. Weight loss for overweight patients is recommended. Even modest amounts of weight loss can significantly improve the severity of sleep apnea.  7. Snoring recommendations include: weight loss where appropriate, side sleeping, and avoidance of alcohol before bed.  8. Operation of motor vehicle should be avoided when sleepy.  Signature: Armanda Magic, MD; [facility]; Diplomat, American Board of Sleep Medicine Electronically Signed: 03/20/2024 6:26:05 PM

## 2024-03-21 ENCOUNTER — Telehealth: Payer: Self-pay

## 2024-03-21 NOTE — Telephone Encounter (Signed)
-----   Message from Armanda Magic sent at 03/20/2024  6:29 PM EDT ----- Patient has very mild OSA - set up OV to discuss treatment options.

## 2024-03-21 NOTE — Telephone Encounter (Signed)
 Left VM with callback number. Informed patient that Sleep study showed mild OSA and our scheduler will be reaching out to to get patient an appointment to discuss treatment options.

## 2024-03-28 ENCOUNTER — Encounter (HOSPITAL_COMMUNITY): Payer: Self-pay

## 2024-03-30 ENCOUNTER — Encounter: Payer: Self-pay | Admitting: Internal Medicine

## 2024-03-30 ENCOUNTER — Ambulatory Visit (HOSPITAL_COMMUNITY)
Admission: RE | Admit: 2024-03-30 | Discharge: 2024-03-30 | Disposition: A | Discharge status: Home / Self Care | Source: Ambulatory Visit | Attending: Internal Medicine | Admitting: Internal Medicine

## 2024-03-30 DIAGNOSIS — R002 Palpitations: Secondary | ICD-10-CM

## 2024-03-30 DIAGNOSIS — R0609 Other forms of dyspnea: Secondary | ICD-10-CM

## 2024-03-30 DIAGNOSIS — R400 Somnolence: Secondary | ICD-10-CM | POA: Diagnosis present

## 2024-03-30 DIAGNOSIS — R9430 Abnormal result of cardiovascular function study, unspecified: Secondary | ICD-10-CM | POA: Diagnosis not present

## 2024-03-30 DIAGNOSIS — Z6825 Body mass index (BMI) 25.0-25.9, adult: Secondary | ICD-10-CM

## 2024-03-30 MED ORDER — NITROGLYCERIN 0.4 MG SL SUBL
SUBLINGUAL_TABLET | SUBLINGUAL | Status: AC
  Filled 2024-03-30: qty 2

## 2024-03-30 MED ORDER — NITROGLYCERIN 0.4 MG SL SUBL
0.8000 mg | SUBLINGUAL_TABLET | Freq: Once | SUBLINGUAL | Status: AC
  Administered 2024-03-30: 0.8 mg via SUBLINGUAL

## 2024-03-30 MED ORDER — IOHEXOL 350 MG/ML SOLN
95.0000 mL | Freq: Once | INTRAVENOUS | Status: AC | PRN
  Administered 2024-03-30: 95 mL via INTRAVENOUS

## 2024-04-02 NOTE — Telephone Encounter (Signed)
 Left message for patient to call back

## 2024-04-03 ENCOUNTER — Telehealth: Payer: Self-pay | Admitting: *Deleted

## 2024-04-03 ENCOUNTER — Telehealth: Payer: Self-pay | Admitting: Internal Medicine

## 2024-04-03 DIAGNOSIS — Z79899 Other long term (current) drug therapy: Secondary | ICD-10-CM

## 2024-04-03 MED ORDER — ASPIRIN 81 MG PO TBEC: 81.0000 mg | DELAYED_RELEASE_TABLET | Freq: Every day | ORAL | Status: DC

## 2024-04-03 NOTE — Telephone Encounter (Signed)
 Spoke with pt regarding her CTA results. Pt is hesitant to start a statin but was agreeable to ASA 81 mg once daily. ASA was ordered for OTC. Pt would like to try to lower cholesterol with diet and exercise before starting medication. Labs were ordered for 2 months out and released. Pt aware. Pt also stated she received a message that her home sleep study showed she has OSA and that someone was supposed to call and schedule and appointment with Dr. Micael Adas, but she has not heard from anyone. Will forward to [place].

## 2024-04-03 NOTE — Telephone Encounter (Signed)
Pt returning call in regards to results. Please advise 

## 2024-04-03 NOTE — Telephone Encounter (Signed)
-----   Message from Armanda Magic sent at 03/20/2024  6:29 PM EDT ----- Patient has very mild OSA - set up OV to discuss treatment options.

## 2024-04-03 NOTE — Telephone Encounter (Signed)
 The patient has been notified of the result and verbalized understanding.  All questions (if any) were answered. Gaylene Kays, CMA 04/03/2024 4:48 PM     Pt is  agreeable to discuss her treatment options will send to the schedulers to make the appointment.

## 2024-04-07 ENCOUNTER — Encounter: Payer: Self-pay | Admitting: Internal Medicine

## 2024-04-16 NOTE — Telephone Encounter (Signed)
 Pt returning call to schedule

## 2024-05-16 ENCOUNTER — Ambulatory Visit: Admitting: Cardiology

## 2024-06-03 ENCOUNTER — Ambulatory Visit: Payer: Self-pay | Admitting: Internal Medicine

## 2024-06-03 DIAGNOSIS — E7841 Elevated Lipoprotein(a): Secondary | ICD-10-CM

## 2024-06-04 LAB — LIPID PANEL
Chol/HDL Ratio: 2.5 ratio (ref 0.0–4.4)
Cholesterol, Total: 200 mg/dL — ABNORMAL HIGH (ref 100–199)
HDL: 80 mg/dL (ref >39)
LDL Chol Calc (NIH): 112 mg/dL — ABNORMAL HIGH (ref 0–99)
Triglycerides: 42 mg/dL (ref 0–149)
VLDL Cholesterol Cal: 8 mg/dL (ref 5–40)

## 2024-06-04 LAB — HEPATIC FUNCTION PANEL
ALT: 15 IU/L (ref 0–32)
AST: 15 IU/L (ref 0–40)
Albumin: 4.4 g/dL (ref 3.8–4.9)
Alkaline Phosphatase: 65 IU/L (ref 44–121)
Bilirubin Total: 0.3 mg/dL (ref 0.0–1.2)
Bilirubin, Direct: 0.12 mg/dL (ref 0.00–0.40)
Total Protein: 6.8 g/dL (ref 6.0–8.5)

## 2024-06-04 LAB — LIPOPROTEIN A (LPA): Lipoprotein (a): 11.3 nmol/L (ref <75.0)

## 2024-06-04 MED ORDER — ATORVASTATIN CALCIUM 20 MG PO TABS: 20.0000 mg | ORAL_TABLET | Freq: Every day | ORAL | 3 refills | Status: DC

## 2024-06-20 NOTE — Addendum Note (Signed)
 Addended by: Gershon Shorten on: 06/20/2024 05:34 PM   Modules accepted: Orders

## 2024-06-21 MED ORDER — ROSUVASTATIN CALCIUM 10 MG PO TABS: 10.0000 mg | ORAL_TABLET | Freq: Every day | ORAL | 3 refills | Status: DC

## 2024-06-21 NOTE — Telephone Encounter (Signed)
 Thukkani, Arun K, MD to [place], Aleck SAILOR, RN  Tanda Petri, RN (Selected Message) AT  06/20/24  6:52 PM Stop atorva, start Crestor 10 and call back in a week with update.   if she can't tolerate than have her see PharmD ________________________________________________   Information sent to patient. Medication list updated.

## 2024-06-21 NOTE — Addendum Note (Signed)
 Addended by: Kimberl Vig on: 06/21/2024 10:41 AM   Modules accepted: Orders

## 2024-06-26 NOTE — Progress Notes (Unsigned)
 Sleep Medicine CONSULT Note    Date:  06/27/2024   ID:  Theresa Cuevas, DOB 1971-02-27, MRN [id number]  PCP:  Rolinda Millman, MD  Cardiologist: Theresa Red, MD  Chief Complaint  Patient presents with   New Patient (Initial Visit)    OSA    History of Present Illness:  Theresa Cuevas is a 53 y.o. female who is being seen today for the evaluation of OSA at the request of Theresa Red, MD.  This is a 53yo female with a hx of anxiety, asthma, GERD and hypothyroidism.  She complained to Dr. Red that she was feeling sleepy during the day.  Stop Bang Score 2.  She underwent home sleep study which revealed mild obstructive sleep apnea with an AHI of 7.2/h.  O2 saturation nadir was 86%.  Events occurred equally between side sleeping supine and prone sleeping.  She is now referred for sleep medicine consultation to discuss possible treatment options.  She tells me that she feels very tired all the day.  She does not feel rested when she wakes up in the am.  She does get up several times to urinate during the night.  She goes to bed at 9:30 to 10pm but takes a long time to get to sleep.  Her husband says that she snores but not all the time.  When she gets up she will feel like she needs to go back to bed.  Usually she will take a nap in the afternoon and nap for 45 min to an hour.  She has chronic headaches but has a hx of migraines that are hormonal.    Past Medical History:  Diagnosis Date   Anxiety    and OCD   Asthma    exercise induced   Compartment syndrome of right lower extremity (HCC)    Exertional compartment syndrome of lower extremity, left 06/01/2016   Fluid retention    on hctz   GERD (gastroesophageal reflux disease)    occasional-uses otc tagamet   Hypothyroidism    PONV (postoperative nausea and vomiting)     Past Surgical History:  Procedure Laterality Date   ABDOMINAL HYSTERECTOMY     DILATION AND CURETTAGE OF UTERUS     ablation    ENDOMETRIAL ABLATION     FASCIOTOMY Left 06/01/2016   Procedure: LEFT LOWER LEG FASCIOTOMY;  Surgeon: Fonda Olmsted, MD;  Location: [facility];  Service: Orthopedics;  Laterality: Left;   LAPAROSCOPIC ASSISTED VAGINAL HYSTERECTOMY  11/23/2011   Procedure: LAPAROSCOPIC ASSISTED VAGINAL HYSTERECTOMY;  Surgeon: Rosaline LITTIE Cobble, MD;  Location: [facility];  Service: Gynecology;  Laterality: N/A;   PERINEOPLASTY  11/23/2011   Procedure: PERINEOPLASTY;  Surgeon: Rosaline LITTIE Cobble, MD;  Location: [facility];  Service: Gynecology;  Laterality: N/A;   ROTATOR CUFF REPAIR     TUBAL LIGATION      Current Medications: Current Meds  Medication Sig   amphetamine-dextroamphetamine (ADDERALL) 20 MG tablet dextroamphetamine-amphetamine 20 mg tablet TAKE 1 TABLET BY MOUTH TWICE DAILY   APLENZIN 522 MG TB24 Aplenzin 522 mg tablet,extended release   EPINEPHrine  0.3 mg/0.3 mL IJ SOAJ injection Inject 0.3 mLs (0.3 mg total) into the muscle as needed for anaphylaxis.   fluvoxaMINE (LUVOX) 100 MG tablet Take 100 mg by mouth at bedtime.   NP THYROID  60 MG tablet NP Thyroid  60 mg tablet   progesterone (PROMETRIUM) 200 MG capsule Take 600 mg by mouth daily.   rosuvastatin  (CRESTOR ) 10 MG tablet  Take 1 tablet (10 mg total) by mouth daily.   spironolactone (ALDACTONE) 50 MG tablet Take 1 tablet twice a day by oral route for 90 days.    Allergies:   Amoxicillin, Atorvastatin , Penicillins, and Prednisone   Social History   Socioeconomic History   Marital status: Married    Spouse name: Not on file   Number of children: Not on file   Years of education: Not on file   Highest education level: Not on file  Occupational History   Not on file  Tobacco Use   Smoking status: Never   Smokeless tobacco: Never  Vaping Use   Vaping status: Never Used  Substance and Sexual Activity   Alcohol use: No   Drug use: No   Sexual activity: Not on file  Other Topics Concern   Not on file  Social History Narrative    Not on file   Social Drivers of Health   Financial Resource Strain: Not on file  Food Insecurity: Low Risk  (04/18/2024)   Received from [facility]   Hunger Vital Sign    Within the past 12 months, you worried that your food would run out before you got money to buy more: Never true    Within the past 12 months, the food you bought just didn't last and you didn't have money to get more. : Never true  Transportation Needs: No Transportation Needs (04/18/2024)   Received from [facility]    In the past 12 months, has lack of reliable transportation kept you from medical appointments, meetings, work or from getting things needed for daily living? : No  Physical Activity: Not on file  Stress: Not on file  Social Connections: Not on file     Family History:  The patient's family history is not on file.   ROS:   Please see the history of present illness.    ROS All other systems reviewed and are negative.      No data to display             PHYSICAL EXAM:   VS:  BP 106/82   Pulse 62   Ht 5' 8 (1.727 m)   Wt 159 lb (72.1 kg)   LMP 11/18/2011   SpO2 99%   BMI 24.18 kg/m    GEN: Well nourished, well developed, in no acute distress  HEENT: normal  Neck: no JVD, carotid bruits, or masses Cardiac: RRR; no murmurs, rubs, or gallops,no edema.  Intact distal pulses bilaterally.  Respiratory:  clear to auscultation bilaterally, normal work of breathing GI: soft, nontender, nondistended, + BS MS: no deformity or atrophy  Skin: warm and dry, no rash Neuro:  Alert and Oriented x 3, Strength and sensation are intact Psych: euthymic mood, full affect  Wt Readings from Last 3 Encounters:  06/27/24 159 lb (72.1 kg)  02/24/24 157 lb (71.2 kg)  05/25/16 188 lb (85.3 kg)      Studies/Labs Reviewed:   Home sleep study  Recent Labs: 06/01/2024: ALT 15    ASSESSMENT:    1. OSA (obstructive sleep apnea)      PLAN:  In order of problems listed  above:  #OSA #Excessive daytime sleepiness - Patient complained of excessive daytime sleepiness although STOP-BANG score was only 3 - Found to have mild obstructive sleep apnea with an AHI of 7.2/h overall with no significant nocturnal hypoxemia. - We have discussed the treatment options for sleep apnea including a trial  of CPAP therapy, referral to sleep dentistry for oral sleep appliance or practicing good sleep hygiene, avoid sleeping supine with no further treatment. -She has decided to proceed with a trial of ResMed auto CPAP from 4 to 12cm H2O with heated humidity and nasal pillow mask with chin strap  Time Spent: 20 minutes total time of encounter, including 15 minutes spent in face-to-face patient care on the date of this encounter. This time includes coordination of care and counseling regarding above mentioned problem list. Remainder of non-face-to-face time involved reviewing chart documents/testing relevant to the patient encounter and documentation in the medical record. I have independently reviewed documentation from referring provider  Medication Adjustments/Labs and Tests Ordered: Current medicines are reviewed at length with the patient today.  Concerns regarding medicines are outlined above.  Medication changes, Labs and Tests ordered today are listed in the Patient Instructions below.  There are no Patient Instructions on file for this visit.   Signed, Wilbert Bihari, MD  06/27/2024 8:15 AM    [facility] [address] [place], [place], [place]  [address] Phone: [phone]; Fax: [phone]

## 2024-06-27 ENCOUNTER — Ambulatory Visit: Attending: Cardiovascular Disease | Admitting: Cardiology

## 2024-06-27 ENCOUNTER — Encounter: Payer: Self-pay | Admitting: Cardiology

## 2024-06-27 VITALS — BP 106/82 | HR 62 | Ht 68.0 in | Wt 159.0 lb

## 2024-06-27 DIAGNOSIS — G4733 Obstructive sleep apnea (adult) (pediatric): Secondary | ICD-10-CM | POA: Diagnosis not present

## 2024-06-27 NOTE — Patient Instructions (Signed)
 Medication Instructions:  Your physician recommends that you continue on your current medications as directed. Please refer to the Current Medication list given to you today.  *If you need a refill on your cardiac medications before your next appointment, please call your pharmacy*  Lab Work: None.  If you have labs (blood work) drawn today and your tests are completely normal, you will receive your results only by: MyChart Message (if you have MyChart) OR A paper copy in the mail If you have any lab test that is abnormal or we need to change your treatment, we will call you to review the results.  Testing/Procedures: None.  Follow-Up: At [facility], you and your health needs are our priority.  As part of our continuing mission to provide you with exceptional heart care, our providers are all part of one team.  This team includes your primary Cardiologist (physician) and Advanced Practice Providers or APPs (Physician Assistants and Nurse Practitioners) who all work together to provide you with the care you need, when you need it.  Your next appointment will be dependent on delivery of your cpap equipment and it will be with:     Provider:   Dr. Wilbert Bihari, MD    Other Instructions Dr. Bihari has ordered cpap equipment for you. If you have not heard anything from your insurance company or from a DME (durable medical equipment) company within 2 weeks, please let our office know via MyChart or via phone call [phone]).

## 2024-06-29 ENCOUNTER — Telehealth: Payer: Self-pay | Admitting: *Deleted

## 2024-06-29 DIAGNOSIS — G4733 Obstructive sleep apnea (adult) (pediatric): Secondary | ICD-10-CM

## 2024-06-29 DIAGNOSIS — R400 Somnolence: Secondary | ICD-10-CM

## 2024-06-29 NOTE — Telephone Encounter (Signed)
-----   Message from Wilbert Bihari sent at 06/27/2024  8:25 AM EDT ----- ResMed auto CPAP from 4 to 12cm H2O with heated humidity and nasal pillow mask with chin strap

## 2024-06-29 NOTE — Telephone Encounter (Signed)
 Order placed to [facility]  DME selection is [facility] Patient understands he will be contacted by [facility] to set up his cpap. Patient understands to call if [facility] does not contact him with new setup in a timely manner. Patient understands they will be called once confirmation has been received from [facility] that they have received their new machine to schedule 10 week follow up appointment.   [facility] notified of new cpap order  Please add to airview Patient was grateful for the call and thanked me.

## 2024-09-22 LAB — LIPID PANEL
Chol/HDL Ratio: 2.1 ratio (ref 0.0–4.4)
Cholesterol, Total: 147 mg/dL (ref 100–199)
HDL: 71 mg/dL (ref >39)
LDL Chol Calc (NIH): 64 mg/dL (ref 0–99)
Triglycerides: 58 mg/dL (ref 0–149)
VLDL Cholesterol Cal: 12 mg/dL (ref 5–40)

## 2024-09-22 LAB — HEPATIC FUNCTION PANEL
ALT: 29 IU/L (ref 0–32)
AST: 27 IU/L (ref 0–40)
Albumin: 4.8 g/dL (ref 3.8–4.9)
Alkaline Phosphatase: 60 IU/L (ref 49–135)
Bilirubin Total: 0.5 mg/dL (ref 0.0–1.2)
Bilirubin, Direct: 0.17 mg/dL (ref 0.00–0.40)
Total Protein: 7 g/dL (ref 6.0–8.5)

## 2024-09-25 ENCOUNTER — Other Ambulatory Visit: Payer: Self-pay | Admitting: Internal Medicine

## 2024-09-26 NOTE — Progress Notes (Signed)
 Cardiology Office Note:   Date:  10/02/2024  ID:  Theresa Cuevas, DOB 1970-12-23, MRN [id number] PCP:  Rolinda Millman, MD  [facility] Providers Cardiologist:  Wendel Haws, MD Referring MD: Rolinda Millman, MD  Chief Complaint/Reason for Referral: Dyspnea ASSESSMENT:    1. Dyspnea on exertion   2. Mild CAD   3. Hyperlipidemia LDL goal <70   4. Aortic atherosclerosis   5. Palpitations   6. BMI 25.0-25.9,adult   7. OSA (obstructive sleep apnea)      PLAN:   In order of problems listed above: Dyspnea: Improved after starting CPAP. CAD: Start aspirin  81 mg, continue rosuvastatin  10 mg Hyperlipidemia: Continue rosuvastatin  10 mg; LDL in October was 64 Aortic atherosclerosis: Start aspirin  81 mg, continue rosuvastatin  10 mg Palpitations: Reassuring monitor and echocardiogram.  Elevated BMI: Diet and exercise modification. OSA: On CPAP.            Dispo:  Return in about 1 year (around 10/02/2025).      Medication Adjustments/Labs and Tests Ordered: Current medicines are reviewed at length with the patient today.  Concerns regarding medicines are outlined above.  The following changes have been made:  no change   Labs/tests ordered: No orders of the defined types were placed in this encounter.   Medication Changes: No orders of the defined types were placed in this encounter.   Current medicines are reviewed at length with the patient today.  The patient does not have concerns regarding medicines.    History of Present Illness:      FOCUSED PROBLEM LIST:   Mild CAD Coronary CTA 2025 Hyperlipidemia LP(a) 11.3 Intolerant of atorvastatin  Aortic atherosclerosis Coronary CTA 2025 MR Mild, EF 55 to 60% TTE 2025 Palpitations Occasional SVT, PACs, PVCs monitor 2025 Hypothyroidism BMI 25 OSA On CPAP  March 2025: Patient consents to use of AI scribe. The patient is a 53 year old female with the above listed medical problems referred for  recommendations regarding dyspnea and tachycardia/palpitations.  The patient was seen by her primary care provider.  She noted that she was short of breath over the summer and believe this was due to the hot weather.  She also noted elevated heart rates at times.  An EKG was not performed at that visit.  She was referred to cardiology for further recommendations.  Since the summer, she has experienced significant shortness of breath and palpitations during physical exertion, such as horseback riding and workouts. These symptoms have persisted into the fall and winter, occurring every time she rides her horse or works out, with her heart rate increasing to 169-172 bpm. She experiences dizziness, dry heaving, and a sensation of her heart 'coming out of my chest'. Her heart rate remains elevated even after stopping the activity, and she feels shaky, needing to take breaks during activities that previously did not require them. She experiences these symptoms at least four times a week, correlating with her riding schedule.  She reports a sensation of heartburn and pain on the left side near her bra line, particularly when lying on her left side. No chest pressure, but she describes a heavy feeling in the chest.  She experiences frequent nighttime awakenings to urinate and feels exhausted despite sleeping for eight hours. She often needs to nap during the day and experiences headaches after physical exertion. No episodes of passing out, swelling in her legs, or significant weight changes.  She is a stay-at-home mom who does not smoke, drink alcohol, or use recreational drugs. She feels  frustrated by her inability to perform activities she used to enjoy without symptoms.  Plan: Coronary CTA, echocardiogram, and sleep apnea evaluation  October 2025:  Patient consents to use of AI scribe. In the interim the patient's coronary CTA demonstrated mild nonobstructive disease.  Her echocardiogram demonstrated mild mitral  digitation with preserved ejection fraction.  Her monitor demonstrated asymptomatic SVT with 18 episodes with the longest lasting 8 beats.  She also had rare PACs and PVCs with no atrial fibrillation.  She was diagnosed with mild obstructive sleep apnea and was started on a trial of CPAP.  She feels better overall, especially during physical activities such as exercising and riding, where her heart no longer feels like it's 'jumping out of my chest.' Although she still experiences a high heart rate, it is not as severe as before. A recent CT scan was performed. Her cholesterol level was reported as 64.  She was diagnosed with mild sleep apnea and has started using a CPAP machine. Despite this, she continues to feel very tired during the day, which she attributes to adjusting to the CPAP and issues with the mask fit. Her breathing has improved significantly compared to when she first sought care, although she still experiences some shortness of breath.          Current Medications: Current Meds  Medication Sig   amphetamine-dextroamphetamine (ADDERALL) 20 MG tablet dextroamphetamine-amphetamine 20 mg tablet TAKE 1 TABLET BY MOUTH TWICE DAILY   APLENZIN 522 MG TB24 Aplenzin 522 mg tablet,extended release   diclofenac (VOLTAREN) 75 MG EC tablet Take 75 mg by mouth 2 (two) times daily.   EPINEPHrine  0.3 mg/0.3 mL IJ SOAJ injection Inject 0.3 mLs (0.3 mg total) into the muscle as needed for anaphylaxis.   Estradiol  10 MCG TABS vaginal tablet SMARTSIG:Tablet(s)   fluvoxaMINE (LUVOX) 100 MG tablet Take 100 mg by mouth at bedtime.   NP THYROID  60 MG tablet NP Thyroid  60 mg tablet   progesterone (PROMETRIUM) 200 MG capsule Take 600 mg by mouth daily.   rosuvastatin  (CRESTOR ) 10 MG tablet Take 1 tablet (10 mg total) by mouth daily.   spironolactone (ALDACTONE) 50 MG tablet Take 1 tablet twice a day by oral route for 90 days.   TRULANCE 3 MG TABS Take 1 tablet by mouth daily.     Review of Systems:    Please see the history of present illness.    All other systems reviewed and are negative.     EKGs/Labs/Other Test Reviewed:   EKG: EKG 2025 demonstrates sinus rhythm  EKG Interpretation Date/Time:    Ventricular Rate:    PR Interval:    QRS Duration:    QT Interval:    QTC Calculation:   R Axis:      Text Interpretation:           Risk Assessment/Calculations:     STOP-Bang Score:  3       Physical Exam:   VS:  BP 110/72   Pulse 60   Ht 5' 8 (1.727 m)   Wt 149 lb (67.6 kg)   LMP 11/18/2011   SpO2 99%   BMI 22.66 kg/m        Wt Readings from Last 3 Encounters:  10/02/24 149 lb (67.6 kg)  06/27/24 159 lb (72.1 kg)  02/24/24 157 lb (71.2 kg)      GENERAL:  No apparent distress, AOx3 HEENT:  No carotid bruits, +2 carotid impulses, no scleral icterus CAR: RRR  no murmurs, gallops,  rubs, or thrills RES:  Clear to auscultation bilaterally ABD:  Soft, nontender, nondistended, positive bowel sounds x 4 VASC:  +2 radial pulses, +2 carotid pulses NEURO:  CN 2-12 grossly intact; motor and sensory grossly intact PSYCH:  No active depression or anxiety EXT:  No edema, ecchymosis, or cyanosis  Signed, Lurena MARLA Red, MD  10/02/2024 8:47 AM    [facility] [address] [place], [place], [place]  [address] Phone: [phone]; Fax: [phone]   Note:  This document was prepared using Dragon voice recognition software and may include unintentional dictation errors.

## 2024-10-02 ENCOUNTER — Encounter: Payer: Self-pay | Admitting: Internal Medicine

## 2024-10-02 ENCOUNTER — Ambulatory Visit: Attending: Internal Medicine | Admitting: Internal Medicine

## 2024-10-02 VITALS — BP 110/72 | HR 60 | Ht 68.0 in | Wt 149.0 lb

## 2024-10-02 DIAGNOSIS — R0609 Other forms of dyspnea: Secondary | ICD-10-CM

## 2024-10-02 DIAGNOSIS — E785 Hyperlipidemia, unspecified: Secondary | ICD-10-CM | POA: Diagnosis not present

## 2024-10-02 DIAGNOSIS — I2510 Atherosclerotic heart disease of native coronary artery without angina pectoris: Secondary | ICD-10-CM

## 2024-10-02 DIAGNOSIS — Z6825 Body mass index (BMI) 25.0-25.9, adult: Secondary | ICD-10-CM

## 2024-10-02 DIAGNOSIS — I700 Atherosclerosis of aorta: Secondary | ICD-10-CM | POA: Diagnosis not present

## 2024-10-02 DIAGNOSIS — G4733 Obstructive sleep apnea (adult) (pediatric): Secondary | ICD-10-CM

## 2024-10-02 DIAGNOSIS — R002 Palpitations: Secondary | ICD-10-CM

## 2024-10-02 NOTE — Patient Instructions (Signed)
 Medication Instructions:  START Aspirin  81 mg once daily   *If you need a refill on your cardiac medications before your next appointment, please call your pharmacy*  Lab Work: None ordered today. If you have labs (blood work) drawn today and your tests are completely normal, you will receive your results only by: MyChart Message (if you have MyChart) OR A paper copy in the mail If you have any lab test that is abnormal or we need to change your treatment, we will call you to review the results.  Testing/Procedures: None ordered today.  Follow-Up: At [facility], you and your health needs are our priority.  As part of our continuing mission to provide you with exceptional heart care, our providers are all part of one team.  This team includes your primary Cardiologist (physician) and Advanced Practice Providers or APPs (Physician Assistants and Nurse Practitioners) who all work together to provide you with the care you need, when you need it.  Your next appointment:   1 year(s)  Provider:   Glendia Ferrier, PA-C

## 2024-10-08 ENCOUNTER — Other Ambulatory Visit: Payer: Self-pay | Admitting: Internal Medicine

## 2024-10-11 ENCOUNTER — Other Ambulatory Visit: Payer: Self-pay

## 2024-10-11 MED ORDER — ROSUVASTATIN CALCIUM 10 MG PO TABS: 10.0000 mg | ORAL_TABLET | Freq: Every day | ORAL | 3 refills | Status: AC

## 2024-10-11 NOTE — Telephone Encounter (Signed)
 Requesting to change mail order.

## 2024-10-11 NOTE — Telephone Encounter (Signed)
 [facility] mail order pharmacy requesting clarification for medication rosuvastatin . Pt is allergic to atorvastatin  and there can be cross-sensitivity, would it be appropriate to continue to dispense medication for pt and is provider aware since drug is in the same allergy group. Please advise     INV# [id number]  PH# [phone]

## 2024-10-24 ENCOUNTER — Telehealth: Payer: Self-pay

## 2024-10-24 NOTE — Telephone Encounter (Signed)
 Theresa Cuevas

## 2024-11-01 ENCOUNTER — Ambulatory Visit: Attending: Cardiology | Admitting: Cardiology

## 2024-11-01 ENCOUNTER — Encounter: Payer: Self-pay | Admitting: Cardiology

## 2024-11-01 VITALS — BP 114/74 | HR 64 | Ht 68.0 in | Wt 147.2 lb

## 2024-11-01 DIAGNOSIS — G4733 Obstructive sleep apnea (adult) (pediatric): Secondary | ICD-10-CM | POA: Diagnosis not present

## 2024-11-01 NOTE — Patient Instructions (Addendum)

## 2024-11-01 NOTE — Progress Notes (Signed)
 Sleep Medicine Note    Date:  11/01/2024   ID:  Theresa Cuevas, DOB 05-Jul-1971, MRN [id number]  PCP:  Rolinda Millman, MD  Cardiologist: Lurena Red, MD  Chief Complaint  Patient presents with   Sleep Apnea    History of Present Illness:  Theresa Cuevas is a 53 y.o. female  with a hx of anxiety, asthma, GERD and hypothyroidism.  She complained to Dr. Red that she was feeling sleepy during the day.  Stop Bang Score 2.  She underwent home sleep study which revealed mild obstructive sleep apnea with an AHI of 7.2/h.  O2 saturation nadir was 86%.  Events occurred equally between side sleeping supine and prone sleeping.    At last office visit we discussed the treatment options for mild obstructive sleep apnea.  It was felt that the oral device was cost prohibitive and so we started her on CPAP with ResMed auto CPAP from 4 to 12cm H2O with heated humidity and nasal pillow mask with chin strap.  She is now back for follow-up.  She is doing well with her PAP device and thinks that she has gotten used to it.  She tolerates the full face mask and feels the pressure is adequate.   She denies any significant mouth or nasal dryness or nasal congestion.  She does not think that she snores. An Epworth Sleepiness Scale score was calculated the office today and this endorsed at 10 consistent with residual daytime sleepiness. She is still feeling tired during the day.  She goes to bed around 10pm and has a lot or problems falling asleep up to an hour. She gets up at 7am.  She gets up 1-2 times to urinate at night. She uses her device 7 hours on average nightly.   Past Medical History:  Diagnosis Date   Anxiety    and OCD   Asthma    exercise induced   Compartment syndrome of right lower extremity    Exertional compartment syndrome of lower extremity, left 06/01/2016   Fluid retention    on hctz   GERD (gastroesophageal reflux disease)    occasional-uses otc tagamet    Hypothyroidism    OSA on CPAP    sleep study which revealed mild obstructive sleep apnea with an AHI of 7.2/h.  O2 saturation nadir was 86%.  on auto CPAP   PONV (postoperative nausea and vomiting)     Past Surgical History:  Procedure Laterality Date   ABDOMINAL HYSTERECTOMY     DILATION AND CURETTAGE OF UTERUS     ablation   ENDOMETRIAL ABLATION     FASCIOTOMY Left 06/01/2016   Procedure: LEFT LOWER LEG FASCIOTOMY;  Surgeon: Fonda Olmsted, MD;  Location: [facility];  Service: Orthopedics;  Laterality: Left;   LAPAROSCOPIC ASSISTED VAGINAL HYSTERECTOMY  11/23/2011   Procedure: LAPAROSCOPIC ASSISTED VAGINAL HYSTERECTOMY;  Surgeon: Rosaline LITTIE Cobble, MD;  Location: [facility];  Service: Gynecology;  Laterality: N/A;   PERINEOPLASTY  11/23/2011   Procedure: PERINEOPLASTY;  Surgeon: Rosaline LITTIE Cobble, MD;  Location: [facility];  Service: Gynecology;  Laterality: N/A;   ROTATOR CUFF REPAIR     TUBAL LIGATION      Current Medications: Current Meds  Medication Sig   amphetamine-dextroamphetamine (ADDERALL) 20 MG tablet dextroamphetamine-amphetamine 20 mg tablet TAKE 1 TABLET BY MOUTH TWICE DAILY   APLENZIN 522 MG TB24 Aplenzin 522 mg tablet,extended release   diclofenac (VOLTAREN) 75 MG EC tablet Take 75 mg by mouth  2 (two) times daily.   EPINEPHrine  0.3 mg/0.3 mL IJ SOAJ injection Inject 0.3 mLs (0.3 mg total) into the muscle as needed for anaphylaxis.   Estradiol  10 MCG TABS vaginal tablet SMARTSIG:Tablet(s)   fluvoxaMINE (LUVOX) 100 MG tablet Take 100 mg by mouth at bedtime.   nitrofurantoin, macrocrystal-monohydrate, (MACROBID) 100 MG capsule Take 100 mg by mouth.   NP THYROID  60 MG tablet NP Thyroid  60 mg tablet   progesterone (PROMETRIUM) 200 MG capsule Take 600 mg by mouth daily.   rosuvastatin  (CRESTOR ) 10 MG tablet Take 1 tablet (10 mg total) by mouth daily.   spironolactone (ALDACTONE) 50 MG tablet Take 1 tablet twice a day by oral route for 90 days.   TRULANCE 3 MG TABS  Take 1 tablet by mouth daily.    Allergies:   Amoxicillin, Atorvastatin , Penicillins, and Prednisone   Social History   Socioeconomic History   Marital status: Married    Spouse name: Not on file   Number of children: Not on file   Years of education: Not on file   Highest education level: Not on file  Occupational History   Not on file  Tobacco Use   Smoking status: Never   Smokeless tobacco: Never  Vaping Use   Vaping status: Never Used  Substance and Sexual Activity   Alcohol use: No   Drug use: No   Sexual activity: Not on file  Other Topics Concern   Not on file  Social History Narrative   Not on file   Social Drivers of Health   Financial Resource Strain: Not on file  Food Insecurity: Low Risk  (10/31/2024)   Received from [facility]   Hunger Vital Sign    Within the past 12 months, you worried that your food would run out before you got money to buy more: Never true    Within the past 12 months, the food you bought just didn't last and you didn't have money to get more. : Never true  Transportation Needs: No Transportation Needs (10/31/2024)   Received from [facility]    In the past 12 months, has lack of reliable transportation kept you from medical appointments, meetings, work or from getting things needed for daily living? : No  Physical Activity: Not on file  Stress: Not on file  Social Connections: Not on file     Family History:  The patient's family history is not on file.   ROS:   Please see the history of present illness.    ROS All other systems reviewed and are negative.      No data to display             PHYSICAL EXAM:   VS:  BP 114/74   Pulse 64   Ht 5' 8 (1.727 m)   Wt 147 lb 3.2 oz (66.8 kg)   LMP 11/18/2011   SpO2 99%   BMI 22.38 kg/m    GEN: Well nourished, well developed in no acute distress HEENT: Normal NECK: No JVD; No carotid bruits LYMPHATICS: No lymphadenopathy CARDIAC:RRR, no murmurs,  rubs, gallops RESPIRATORY:  Clear to auscultation without rales, wheezing or rhonchi  ABDOMEN: Soft, non-tender, non-distended MUSCULOSKELETAL:  No edema; No deformity  SKIN: Warm and dry NEUROLOGIC:  Alert and oriented x 3 PSYCHIATRIC:  Normal affect  Wt Readings from Last 3 Encounters:  11/01/24 147 lb 3.2 oz (66.8 kg)  10/02/24 149 lb (67.6 kg)  06/27/24 159 lb (72.1 kg)  Studies/Labs Reviewed:   Home sleep study  Recent Labs: 09/21/2024: ALT 29    ASSESSMENT:    1. OSA (obstructive sleep apnea)       PLAN:  In order of problems listed above:  OSA - The patient is tolerating PAP therapy well without any problems. The PAP download performed by his DME was personally reviewed and interpreted by me today and showed an AHI of 1.2 /hr on auto CPAP of 4-12 cm H2O with 90% compliance in using more than 4 hours nightly.  The patient has been using and benefiting from PAP use and will continue to benefit from therapy.  -she is still having problems with fatigue during the day but gets at least 8 hours of sleep and uses her PCP over 7 hours nightly so I do not think it is related to residual OSA as her AHI is good -she has a hard time getting to sleep at night due to having a hard time turning her brain off>>recommended a trial of melatonin -she is seeing urogynecology due to frequent urination at night  Time Spent: 20 minutes total time of encounter, including 15 minutes spent in face-to-face patient care on the date of this encounter. This time includes coordination of care and counseling regarding above mentioned problem list. Remainder of non-face-to-face time involved reviewing chart documents/testing relevant to the patient encounter and documentation in the medical record. I have independently reviewed documentation from referring provider  Followup with me in 1 year  Medication Adjustments/Labs and Tests Ordered: Current medicines are reviewed at length with the patient  today.  Concerns regarding medicines are outlined above.  Medication changes, Labs and Tests ordered today are listed in the Patient Instructions below.   Signed, Wilbert Bihari, MD  11/01/2024 10:52 AM    [facility] [address] [place], [place], [place]  [address] Phone: [phone]; Fax: [phone]

## 2025-01-23 ENCOUNTER — Other Ambulatory Visit: Payer: Self-pay | Admitting: Family

## 2025-01-23 DIAGNOSIS — N6312 Unspecified lump in the right breast, upper inner quadrant: Secondary | ICD-10-CM

## 2025-01-30 ENCOUNTER — Encounter

## 2025-01-30 ENCOUNTER — Other Ambulatory Visit
